# Patient Record
Sex: Male | Born: 1950 | Race: White | Hispanic: No | Marital: Married | Smoking: Former smoker
Health system: Southern US, Community
[De-identification: ages and names within clinical notes are randomized; demographics above are authoritative.]

## PROBLEM LIST (undated history)

## (undated) DIAGNOSIS — E041 Nontoxic single thyroid nodule: Secondary | ICD-10-CM

## (undated) DIAGNOSIS — Z9889 Other specified postprocedural states: Secondary | ICD-10-CM

## (undated) DIAGNOSIS — I10 Essential (primary) hypertension: Secondary | ICD-10-CM

## (undated) DIAGNOSIS — C73 Malignant neoplasm of thyroid gland: Secondary | ICD-10-CM

## (undated) DIAGNOSIS — Z973 Presence of spectacles and contact lenses: Secondary | ICD-10-CM

## (undated) DIAGNOSIS — R972 Elevated prostate specific antigen [PSA]: Secondary | ICD-10-CM

## (undated) DIAGNOSIS — G5603 Carpal tunnel syndrome, bilateral upper limbs: Secondary | ICD-10-CM

## (undated) DIAGNOSIS — Z86010 Personal history of colonic polyps: Secondary | ICD-10-CM

## (undated) DIAGNOSIS — C4491 Basal cell carcinoma of skin, unspecified: Secondary | ICD-10-CM

## (undated) DIAGNOSIS — M1990 Unspecified osteoarthritis, unspecified site: Secondary | ICD-10-CM

## (undated) DIAGNOSIS — K5790 Diverticulosis of intestine, part unspecified, without perforation or abscess without bleeding: Secondary | ICD-10-CM

## (undated) DIAGNOSIS — R7301 Impaired fasting glucose: Secondary | ICD-10-CM

## (undated) DIAGNOSIS — R112 Nausea with vomiting, unspecified: Secondary | ICD-10-CM

## (undated) DIAGNOSIS — E7800 Pure hypercholesterolemia, unspecified: Secondary | ICD-10-CM

## (undated) DIAGNOSIS — J61 Pneumoconiosis due to asbestos and other mineral fibers: Secondary | ICD-10-CM

## (undated) DIAGNOSIS — T4145XA Adverse effect of unspecified anesthetic, initial encounter: Secondary | ICD-10-CM

## (undated) DIAGNOSIS — R030 Elevated blood-pressure reading, without diagnosis of hypertension: Secondary | ICD-10-CM

## (undated) DIAGNOSIS — Z87442 Personal history of urinary calculi: Secondary | ICD-10-CM

## (undated) HISTORY — DX: Diverticulosis of intestine, part unspecified, without perforation or abscess without bleeding: K57.90

## (undated) HISTORY — DX: Elevated prostate specific antigen (PSA): R97.20

## (undated) HISTORY — DX: Basal cell carcinoma of skin, unspecified: C44.91

## (undated) HISTORY — DX: Elevated blood-pressure reading, without diagnosis of hypertension: R03.0

## (undated) HISTORY — DX: Carpal tunnel syndrome, bilateral upper limbs: G56.03

## (undated) HISTORY — DX: Pure hypercholesterolemia, unspecified: E78.00

## (undated) HISTORY — PX: OTHER SURGICAL HISTORY: SHX169

## (undated) HISTORY — DX: Pneumoconiosis due to asbestos and other mineral fibers: J61

## (undated) HISTORY — DX: Malignant neoplasm of thyroid gland: C73

## (undated) HISTORY — PX: LITHOTRIPSY: SUR834

## (undated) HISTORY — DX: Impaired fasting glucose: R73.01

## (undated) HISTORY — DX: Essential (primary) hypertension: I10

---
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
## 1998-02-06 ENCOUNTER — Ambulatory Visit (HOSPITAL_COMMUNITY): Admission: RE | Admit: 1998-02-06 | Discharge: 1998-02-06 | Payer: Self-pay

## 1998-02-06 ENCOUNTER — Encounter: Payer: Self-pay | Admitting: Family Medicine

## 2004-04-09 ENCOUNTER — Ambulatory Visit: Payer: Self-pay | Admitting: Family Medicine

## 2009-11-06 ENCOUNTER — Ambulatory Visit (HOSPITAL_COMMUNITY): Admission: RE | Admit: 2009-11-06 | Discharge: 2009-11-06 | Payer: Self-pay | Admitting: Family Medicine

## 2012-04-21 DIAGNOSIS — Z8639 Personal history of other endocrine, nutritional and metabolic disease: Secondary | ICD-10-CM

## 2012-04-21 HISTORY — DX: Personal history of other endocrine, nutritional and metabolic disease: Z86.39

## 2013-05-25 ENCOUNTER — Other Ambulatory Visit (HOSPITAL_COMMUNITY): Payer: Self-pay | Admitting: Family Medicine

## 2013-05-25 DIAGNOSIS — E049 Nontoxic goiter, unspecified: Secondary | ICD-10-CM

## 2013-05-30 ENCOUNTER — Ambulatory Visit (HOSPITAL_COMMUNITY): Payer: Self-pay

## 2013-05-31 ENCOUNTER — Ambulatory Visit (HOSPITAL_COMMUNITY)
Admission: RE | Admit: 2013-05-31 | Discharge: 2013-05-31 | Disposition: A | Discharge status: Home / Self Care | Payer: 59 | Source: Ambulatory Visit | Attending: Family Medicine | Admitting: Family Medicine

## 2013-05-31 DIAGNOSIS — E041 Nontoxic single thyroid nodule: Secondary | ICD-10-CM | POA: Insufficient documentation

## 2013-05-31 DIAGNOSIS — E049 Nontoxic goiter, unspecified: Secondary | ICD-10-CM

## 2013-05-31 DIAGNOSIS — J61 Pneumoconiosis due to asbestos and other mineral fibers: Secondary | ICD-10-CM | POA: Insufficient documentation

## 2013-06-01 ENCOUNTER — Other Ambulatory Visit: Payer: Self-pay | Admitting: Family Medicine

## 2013-06-01 DIAGNOSIS — E041 Nontoxic single thyroid nodule: Secondary | ICD-10-CM

## 2013-06-07 ENCOUNTER — Ambulatory Visit
Admission: RE | Admit: 2013-06-07 | Discharge: 2013-06-07 | Disposition: A | Discharge status: Home / Self Care | Payer: 59 | Source: Ambulatory Visit | Attending: Family Medicine | Admitting: Family Medicine

## 2013-06-07 ENCOUNTER — Other Ambulatory Visit (HOSPITAL_COMMUNITY)
Admission: RE | Admit: 2013-06-07 | Discharge: 2013-06-07 | Disposition: A | Discharge status: Home / Self Care | Payer: 59 | Source: Ambulatory Visit | Attending: Interventional Radiology | Admitting: Interventional Radiology

## 2013-06-07 DIAGNOSIS — E049 Nontoxic goiter, unspecified: Secondary | ICD-10-CM | POA: Insufficient documentation

## 2013-06-07 DIAGNOSIS — E041 Nontoxic single thyroid nodule: Secondary | ICD-10-CM

## 2013-06-14 ENCOUNTER — Telehealth (HOSPITAL_COMMUNITY): Payer: Self-pay | Admitting: Hematology and Oncology

## 2013-06-16 DIAGNOSIS — E041 Nontoxic single thyroid nodule: Secondary | ICD-10-CM | POA: Insufficient documentation

## 2013-06-17 ENCOUNTER — Encounter (HOSPITAL_COMMUNITY): Payer: 59

## 2013-06-17 ENCOUNTER — Encounter (HOSPITAL_COMMUNITY): Payer: Self-pay

## 2013-06-17 ENCOUNTER — Encounter (HOSPITAL_COMMUNITY): Payer: 59 | Attending: Hematology and Oncology

## 2013-06-17 VITALS — BP 153/97 | HR 77 | Temp 97.5°F | Resp 20 | Ht 66.0 in | Wt 174.0 lb

## 2013-06-17 DIAGNOSIS — E041 Nontoxic single thyroid nodule: Secondary | ICD-10-CM

## 2013-06-17 DIAGNOSIS — E7800 Pure hypercholesterolemia, unspecified: Secondary | ICD-10-CM | POA: Insufficient documentation

## 2013-06-17 DIAGNOSIS — I10 Essential (primary) hypertension: Secondary | ICD-10-CM | POA: Insufficient documentation

## 2013-06-17 DIAGNOSIS — J61 Pneumoconiosis due to asbestos and other mineral fibers: Secondary | ICD-10-CM | POA: Insufficient documentation

## 2013-06-17 DIAGNOSIS — Z85828 Personal history of other malignant neoplasm of skin: Secondary | ICD-10-CM | POA: Insufficient documentation

## 2013-06-17 DIAGNOSIS — Z85850 Personal history of malignant neoplasm of thyroid: Secondary | ICD-10-CM | POA: Insufficient documentation

## 2013-06-17 DIAGNOSIS — E039 Hypothyroidism, unspecified: Secondary | ICD-10-CM

## 2013-06-17 LAB — RETICULOCYTES
RBC.: 5.28 MIL/uL (ref 4.22–5.81)
Retic Count, Absolute: 52.8 10*3/uL (ref 19.0–186.0)
Retic Ct Pct: 1 % (ref 0.4–3.1)

## 2013-06-17 LAB — CBC WITH DIFFERENTIAL/PLATELET
BASOS PCT: 1 % (ref 0–1)
Basophils Absolute: 0 10*3/uL (ref 0.0–0.1)
EOS ABS: 0.4 10*3/uL (ref 0.0–0.7)
Eosinophils Relative: 4 % (ref 0–5)
HEMATOCRIT: 46.7 % (ref 39.0–52.0)
Hemoglobin: 16.4 g/dL (ref 13.0–17.0)
LYMPHS ABS: 2.5 10*3/uL (ref 0.7–4.0)
Lymphocytes Relative: 28 % (ref 12–46)
MCH: 31.1 pg (ref 26.0–34.0)
MCHC: 35.1 g/dL (ref 30.0–36.0)
MCV: 88.4 fL (ref 78.0–100.0)
MONO ABS: 0.6 10*3/uL (ref 0.1–1.0)
Monocytes Relative: 7 % (ref 3–12)
Neutro Abs: 5.4 10*3/uL (ref 1.7–7.7)
Neutrophils Relative %: 61 % (ref 43–77)
Platelets: 250 10*3/uL (ref 150–400)
RBC: 5.28 MIL/uL (ref 4.22–5.81)
RDW: 13.6 % (ref 11.5–15.5)
WBC: 8.8 10*3/uL (ref 4.0–10.5)

## 2013-06-17 LAB — COMPREHENSIVE METABOLIC PANEL
ALBUMIN: 4.4 g/dL (ref 3.5–5.2)
ALK PHOS: 38 U/L — AB (ref 39–117)
ALT: 21 U/L (ref 0–53)
AST: 24 U/L (ref 0–37)
BUN: 21 mg/dL (ref 6–23)
CO2: 23 meq/L (ref 19–32)
CREATININE: 0.96 mg/dL (ref 0.50–1.35)
Calcium: 9.5 mg/dL (ref 8.4–10.5)
Chloride: 104 mEq/L (ref 96–112)
GFR calc Af Amer: >90 mL/min (ref >90)
GFR, EST NON AFRICAN AMERICAN: 88 mL/min — AB (ref >90)
Glucose, Bld: 92 mg/dL (ref 70–99)
Potassium: 4.2 mEq/L (ref 3.7–5.3)
Sodium: 139 mEq/L (ref 137–147)
Total Bilirubin: 0.5 mg/dL (ref 0.3–1.2)
Total Protein: 7.6 g/dL (ref 6.0–8.3)

## 2013-06-17 LAB — LACTATE DEHYDROGENASE: LDH: 205 U/L (ref 94–250)

## 2013-06-17 NOTE — Progress Notes (Signed)
Jason Hansen. presented for labwork. Labs per MD order drawn via Peripheral Line 23 gauge needle inserted in Right AC per Lupita Raider, RN  Good blood return present. Procedure without incident.  Needle removed intact. Patient tolerated procedure well.

## 2013-06-17 NOTE — Progress Notes (Signed)
[place] A. Barnet Glasgow, M.D.  NEW PATIENT EVALUATION   Name: Jason Hansen. Date: 06/17/2013 MRN: [id number] DOB: 1950-06-27  PCP: Jason Mustache, MD   REFERRING PHYSICIAN: No ref. provider Hansen  REASON FOR REFERRAL: Thyroid nodule, status post FNA.     HISTORY OF PRESENT ILLNESS:Jason Hansen. is a 63 y.o. male who is referred by his primary care physician because of recent findings on FNA of a thyroid nodule. 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 The last day revealed an abnormality in the midportion of the left lobe of the thyroid gland. He underwent fine-needle aspiration of that area on 06/07/2013 with findings of follicular architecture with some papillary atypia. He is here today for discussion of the results and plans for management. He remains asymptomatic with Jason cough, wheezing, sore throat, headache, dysphagia, odynophagia, regurgitation, neck pain, but with chronic back pain. He denies any peripheral paresthesias other than the right upper extremity involving the thumb and first 2 fingers intermittently. He denies any lower extremity swelling or redness, PND, orthopnea, palpitations, headache, or seizures.   PAST MEDICAL HISTORY:  has a past medical history of Thyroid ca; Basal cell carcinoma (nose); Hypertension; Kidney stones (active bilaterally); and Asbestosis.     PAST SURGICAL HISTORY: Past Surgical History  Procedure Laterality Date  . Lithotripsy      pt states that this was done a long time ago     CURRENT MEDICATIONS: has a current medication list which includes the following prescription(s): aspirin, atorvastatin, and naproxen sodium.   ALLERGIES: Review of patient's allergies indicates Jason known allergies.   SOCIAL HISTORY:  reports that he has quit smoking. He has never used smokeless tobacco. He reports that he does not drink alcohol  or use illicit drugs.   FAMILY HISTORY: family history includes Breast cancer in his mother and sister; Colon cancer in his father.    REVIEW OF SYSTEMS:  Other than that discussed above is noncontributory.    PHYSICAL EXAM:  height is 5\' 6"  (1.676 m) and weight is 174 lb (78.926 kg). His oral temperature is 97.5 F (36.4 C). His blood pressure is 153/97 and his pulse is 77. His respiration is 20.    GENERAL:alert, Jason distress and comfortable SKIN: skin color, texture, turgor are normal, Jason rashes or significant lesions EYES: normal, Conjunctiva are pink and non-injected, sclera clear OROPHARYNX:Jason exudate, Jason erythema and lips, buccal mucosa, and tongue normal  NECK: supple, thyroid normal size, non-tender, without nodularity on physical exam. CHEST: Normal AP diameter with Jason breast masses. LYMPH:  Jason palpable lymphadenopathy in the cervical, axillary or inguinal LUNGS: clear to auscultation and percussion with normal breathing effort. Minimal is dry crackles at the bases. HEART: regular rate & rhythm and Jason murmurs ABDOMEN:abdomen soft, non-tender and normal bowel sounds MUSCULOSKELETALl:Jason cyanosis of digits, Jason clubbing or edema  NEURO: alert & oriented x 3 with fluent speech, Jason focal motor/sensory deficits    LABORATORY DATA:  Jason results Hansen for any previous visit.  Urinalysis Jason results Hansen for this basename: colorurine,  appearanceur,  labspec,  phurine,  glucoseu,  hgbur,  bilirubinur,  ketonesur,  proteinur,  urobilinogen,  nitrite,  leukocytesur      @RADIOGRAPHY : US Soft Tissue Head/neck  05/31/2013   CLINICAL DATA:  Thyromegaly, asbestosis.  EXAM: THYROID ULTRASOUND  TECHNIQUE: Ultrasound examination of the thyroid gland and adjacent soft tissues  was performed.  COMPARISON:  None.  FINDINGS: Right thyroid lobe  Measurements: 51 x 23 x 17 mm, inhomogeneous echotexture without focal lesion or hyperemia.  Left thyroid lobe  Measurements: 47 x 18 x 19 mm, with a  single 16 x 11 x 11 mm solid nodule, mid lobe.  Isthmus  Thickness: 3.2 mm.  Jason nodules visualized.  Lymphadenopathy  None visualized.  IMPRESSION: 1. Thyromegaly with a single left nodule. Findings meet consensus criteria for biopsy. Ultrasound-guided fine needle aspiration should be considered, as per the consensus statement: Management of Thyroid Nodules Detected at [place]: Society of Radiologists in [place]. Radiology 2005; [id number].   Electronically Signed   By: Jason Hansen M.D.   On: 05/31/2013 12:54   US Thyroid Biopsy  06/07/2013   CLINICAL DATA:  Dominant left thyroid nodule.  EXAM: ULTRASOUND GUIDED NEEDLE ASPIRATE BIOPSY OF THE THYROID GLAND  COMPARISON:  US SOFT TISSUE HEAD/NECK dated 05/31/2013  FINDINGS: Needle aspirate samples were obtained in different portions of the left thyroid nodule.  IMPRESSION: Ultrasound guided needle aspirate biopsy performed of the dominant left thyroid nodule.  PROCEDURE: Thyroid biopsy was thoroughly discussed with the patient and questions were answered. The benefits, risks, alternatives, and complications were also discussed. The patient understands and wishes to proceed with the procedure. Written consent was obtained.  Ultrasound was performed to localize and mark an adequate site for the biopsy. The patient was then prepped and draped in a normal sterile fashion. Local anesthesia was provided with 1% lidocaine. Using direct ultrasound guidance, 4 passes were made using 25 gauge needles into the nodule within the left lobe of the thyroid. Ultrasound was used to confirm needle placements on all occasions. Specimens were sent to Pathology for analysis.  Complications:  None   Electronically Signed   By: Jason Hansen M.D.   On: 06/07/2013 15:27    PATHOLOGY: for Jason Hansen ([id number]) Patient: Jason Hansen, Jason Hansen Collected: 06/07/2013 Client: Jason Hansen Accession: [id number] Received: 06/08/2013 Jason Edouard, MD DOB: [birth year] Age: 45 Gender: M Reported: 06/08/2013 [address] Patient Ph: [phone] MRN#: [id number] [place], [place] [address] Client Acc#: Chart: Phone: [phone] Fax: LMP: Visit#: [id number] CC: Jason Housekeeper, MD CYTOPATHOLOGY REPORT Adequacy Reason Satisfactory For Evaluation. Diagnosis THYROID, FINE NEEDLE ASPIRATION, LEFT (SPECIMEN 1 OF 1 COLLECTED [id number]) FOLLICULAR LESION OF UNDETERMINED SIGNIFICANCE. SEE COMMENT. COMMENT: THE SPECIMEN IS HYPERCELLULAR AND CONSISTS OF SMALL GROUPS OF FOLLICULAR EPITHELIAL CELLS WITH CYTOLOGIC ATYPIA. SOME EPITHELIAL CELLS CONTAIN INTRANUCLEAR GROOVES. BASED ON THESE FINDINGS, A FOLLICULAR LESION, INCLUDING FOLLICULAR VARIANT OF PAPILLARY THYROID CARCINOMA, CAN NOT BE ENTIRELY RULED OUT. Enid Cutter MD Pathologist, Electronic Signature (Case signed 06/08/2013) Specimen Clinical Information Thyromegaly, Asbestosis, 16 x 11 x 63mm solid nodule, left mid lobe Source Thyroid, Fine Needle Aspiration, Left, (Specimen 1 of 1, collected on 06/07/2013) Gross Specimen: Received is/are 30cc's of dark peach cytolyt solution and 6 slides in 95% ethyl alcohol.(PH:gw) Prepared: # Smears: 6 # Concentration Technique Slides (i.e. ThinPrep): 1 # Cell Block: 1 Cellient Additional Studies: N/A Report signed out from the following location(s) Technical Component and Interpretation performed at [facility], [place], [place] [address]. CLIA #: [id number],  IMPRESSION:  #1. 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 #2. Pulmonary asbestosis. #3. Hypercholesterolemia, on treatment.   PLAN:  #1. Thyroid function tests. #2. Thyroid uptake. #3. Followup in 2 weeks for discussion of findings. Is  a cold nodule is diagnosed, referral to surgery will be made. If not, suppression therapy with Synthroid will  be initiated. #4. Both the patient and his wife who accompanied him today and agree to this strategy.  I appreciate the opportunity of sharing in his care.   Doroteo Bradford, MD 06/17/2013 4:30 PM

## 2013-06-17 NOTE — Patient Instructions (Addendum)
[facility] Discharge Instructions  RECOMMENDATIONS MADE BY THE CONSULTANT AND ANY TEST RESULTS WILL BE SENT TO YOUR REFERRING PHYSICIAN.  EXAM FINDINGS BY THE PHYSICIAN TODAY AND SIGNS OR SYMPTOMS TO REPORT TO CLINIC OR PRIMARY PHYSICIAN:  We are doing labs today. We will have results by Monday 06/20/13.   Please return to see Dr. Barnet Glasgow in 2 weeks on Wednesday 06/29/13 @ 2:30.  Dr. Barnet Glasgow is ordering you a thyroid scan and this will determine the next step in this process.     Thank you for choosing [facility] to provide your oncology and hematology care.  To afford each patient quality time with our providers, please arrive at least 15 minutes before your scheduled appointment time.  With your help, our goal is to use those 15 minutes to complete the necessary work-up to ensure our physicians have the information they need to help with your evaluation and healthcare recommendations.    Effective January 1st, 2014, we ask that you re-schedule your appointment with our physicians should you arrive 10 or more minutes late for your appointment.  We strive to give you quality time with our providers, and arriving late affects you and other patients whose appointments are after yours.    Again, thank you for choosing [facility].  Our hope is that these requests will decrease the amount of time that you wait before being seen by our physicians.       _____________________________________________________________  Should you have questions after your visit to [facility], please contact our office at [phone] between the hours of 8:30 a.m. and 5:00 p.m.  Voicemails left after 4:30 p.m. will not be returned until the following business day.  For prescription refill requests, have your pharmacy contact our office with your prescription refill request.

## 2013-06-21 LAB — T3 UPTAKE: T3 UPTAKE RATIO: 30.6 % (ref 22.5–37.0)

## 2013-06-21 LAB — TSH: TSH: 1.052 u[IU]/mL (ref 0.350–4.500)

## 2013-06-21 LAB — T4, FREE: Free T4: 1.14 ng/dL (ref 0.80–1.80)

## 2013-06-21 LAB — T4: T4 TOTAL: 8.8 ug/dL (ref 5.0–12.5)

## 2013-06-23 ENCOUNTER — Encounter (HOSPITAL_COMMUNITY)
Admission: RE | Admit: 2013-06-23 | Discharge: 2013-06-23 | Disposition: A | Discharge status: Home / Self Care | Payer: 59 | Source: Ambulatory Visit | Attending: Hematology and Oncology | Admitting: Hematology and Oncology

## 2013-06-23 ENCOUNTER — Encounter (HOSPITAL_COMMUNITY): Payer: Self-pay

## 2013-06-23 DIAGNOSIS — J61 Pneumoconiosis due to asbestos and other mineral fibers: Secondary | ICD-10-CM

## 2013-06-23 DIAGNOSIS — E041 Nontoxic single thyroid nodule: Secondary | ICD-10-CM

## 2013-06-23 MED ORDER — SODIUM IODIDE I 131 CAPSULE
17.0000 | Freq: Once | INTRAVENOUS | Status: AC | PRN
  Administered 2013-06-23: 17 via ORAL

## 2013-06-24 ENCOUNTER — Encounter (HOSPITAL_COMMUNITY)
Admission: RE | Admit: 2013-06-24 | Discharge: 2013-06-24 | Disposition: A | Discharge status: Home / Self Care | Payer: 59 | Source: Ambulatory Visit | Attending: Hematology and Oncology | Admitting: Hematology and Oncology

## 2013-06-24 MED ORDER — SODIUM PERTECHNETATE TC 99M INJECTION
10.0000 | Freq: Once | INTRAVENOUS | Status: AC | PRN
  Administered 2013-06-24: 10 via INTRAVENOUS

## 2013-06-29 ENCOUNTER — Encounter (HOSPITAL_COMMUNITY): Payer: 59 | Attending: Hematology and Oncology

## 2013-06-29 ENCOUNTER — Other Ambulatory Visit (HOSPITAL_COMMUNITY): Payer: Self-pay | Admitting: Hematology and Oncology

## 2013-06-29 ENCOUNTER — Encounter (HOSPITAL_COMMUNITY): Payer: Self-pay

## 2013-06-29 VITALS — BP 135/82 | HR 69 | Temp 97.2°F | Resp 18 | Wt 175.0 lb

## 2013-06-29 DIAGNOSIS — C73 Malignant neoplasm of thyroid gland: Secondary | ICD-10-CM

## 2013-06-29 DIAGNOSIS — J61 Pneumoconiosis due to asbestos and other mineral fibers: Secondary | ICD-10-CM

## 2013-06-29 DIAGNOSIS — E041 Nontoxic single thyroid nodule: Secondary | ICD-10-CM | POA: Insufficient documentation

## 2013-06-29 DIAGNOSIS — E7800 Pure hypercholesterolemia, unspecified: Secondary | ICD-10-CM

## 2013-06-29 NOTE — Patient Instructions (Addendum)
[facility] Discharge Instructions  RECOMMENDATIONS MADE BY THE CONSULTANT AND ANY TEST RESULTS WILL BE SENT TO YOUR REFERRING PHYSICIAN.  EXAM FINDINGS BY THE PHYSICIAN TODAY AND SIGNS OR SYMPTOMS TO REPORT TO CLINIC OR PRIMARY PHYSICIAN: Exam and findings as discussed by Dr. Barnet Glasgow.  Will make a  referral for surgical consultation with Dr. Armandina Gemma.  MEDICATIONS PRESCRIBED:  none  INSTRUCTIONS/FOLLOW-UP: Follow-up with labs and office visit in 2 months.  Thank you for choosing [facility] to provide your oncology and hematology care.  To afford each patient quality time with our providers, please arrive at least 15 minutes before your scheduled appointment time.  With your help, our goal is to use those 15 minutes to complete the necessary work-up to ensure our physicians have the information they need to help with your evaluation and healthcare recommendations.    Effective January 1st, 2014, we ask that you re-schedule your appointment with our physicians should you arrive 10 or more minutes late for your appointment.  We strive to give you quality time with our providers, and arriving late affects you and other patients whose appointments are after yours.    Again, thank you for choosing [facility].  Our hope is that these requests will decrease the amount of time that you wait before being seen by our physicians.       _____________________________________________________________  Should you have questions after your visit to [facility], please contact our office at [phone] between the hours of 8:30 a.m. and 5:00 p.m.  Voicemails left after 4:30 p.m. will not be returned until the following business day.  For prescription refill requests, have your pharmacy contact our office with your prescription refill request.

## 2013-06-29 NOTE — Progress Notes (Signed)
Jason Hansen Hansen  OFFICE PROGRESS NOTE  Jason Hansen [place] [address]  DIAGNOSIS: No diagnosis found.  Chief Complaint  Patient presents with  . Thyroid Nodule     CURRENT THERAPY: Thyroid nodule, status post FNA.  INTERVAL HISTORY: Jason Hansen Hansen. 63 y.o. male returns for followup following thyroid scan after fine-needle aspirate of the mid left thyroid nodule was cellular but not diagnostic of malignancy. The nodule was discovered on CT scan which is done yearly by his pulmonary physician because of asbestosis.  He offers no new complaints. Appetite is good with no dysphagia, cough, stridor, PND, orthopnea, palpitations, lower extremity swelling or redness, peripheral paresthesias, joint pain, headache, or seizures.  MEDICAL HISTORY: Past Medical History  Diagnosis Date  . Thyroid ca   . Basal cell carcinoma nose    removed approx year 2000  . Hypertension   . Kidney stones active bilaterally  . Asbestosis     INTERIM HISTORY: has Thyroid nodule on his problem list.    ALLERGIES:  has No Known Allergies.  MEDICATIONS: has a current medication list which includes the following prescription(s): aspirin, atorvastatin, and naproxen sodium.  SURGICAL HISTORY:  Past Surgical History  Procedure Laterality Date  . Lithotripsy      pt states that this was done a long time ago    FAMILY HISTORY: family history includes Breast cancer in his mother and sister; Colon cancer in his father.  SOCIAL HISTORY:  reports that he has quit smoking. He has never used smokeless tobacco. He reports that he does not drink alcohol or use illicit drugs.  REVIEW OF SYSTEMS:  Other than that discussed above is noncontributory.  PHYSICAL EXAMINATION: ECOG PERFORMANCE STATUS: 0 - Asymptomatic  Weight 175 lb (79.379 kg).  GENERAL:alert, no distress and comfortable SKIN: skin color, texture, turgor are normal, no  rashes or significant lesions EYES: PERLA; Conjunctiva are pink and non-injected, sclera clear OROPHARYNX:no exudate, no erythema on lips, buccal mucosa, or tongue. NECK: supple, thyroid normal size, non-tender, without nodularity on physical exam. No masses CHEST: Normal AP diameter with no gynecomastia. LYMPH:  no palpable lymphadenopathy in the cervical, axillary or inguinal LUNGS: clear to auscultation and percussion with normal breathing effort HEART: regular rate & rhythm and no murmurs. ABDOMEN:abdomen soft, non-tender and normal bowel sounds MUSCULOSKELETAL:no cyanosis of digits and no clubbing. Range of motion normal.  NEURO: alert & oriented x 3 with fluent speech, no focal motor/sensory deficits   LABORATORY DATA: Office Visit on 06/17/2013  Component Date Value Ref Range Status  . WBC 06/17/2013 8.8  4.0 - 10.5 K/uL Final  . RBC 06/17/2013 5.28  4.22 - 5.81 MIL/uL Final  . Hemoglobin 06/17/2013 16.4  13.0 - 17.0 g/dL Final  . HCT 06/17/2013 46.7  39.0 - 52.0 % Final  . MCV 06/17/2013 88.4  78.0 - 100.0 fL Final  . MCH 06/17/2013 31.1  26.0 - 34.0 pg Final  . MCHC 06/17/2013 35.1  30.0 - 36.0 g/dL Final  . RDW 06/17/2013 13.6  11.5 - 15.5 % Final  . Platelets 06/17/2013 250  150 - 400 K/uL Final  . Neutrophils Relative % 06/17/2013 61  43 - 77 % Final  . Neutro Abs 06/17/2013 5.4  1.7 - 7.7 K/uL Final  . Lymphocytes Relative 06/17/2013 28  12 - 46 % Final  . Lymphs Abs 06/17/2013 2.5  0.7 - 4.0 K/uL Final  . Monocytes  Relative 06/17/2013 7  3 - 12 % Final  . Monocytes Absolute 06/17/2013 0.6  0.1 - 1.0 K/uL Final  . Eosinophils Relative 06/17/2013 4  0 - 5 % Final  . Eosinophils Absolute 06/17/2013 0.4  0.0 - 0.7 K/uL Final  . Basophils Relative 06/17/2013 1  0 - 1 % Final  . Basophils Absolute 06/17/2013 0.0  0.0 - 0.1 K/uL Final  . Retic Ct Pct 06/17/2013 1.0  0.4 - 3.1 % Final  . RBC. 06/17/2013 5.28  4.22 - 5.81 MIL/uL Final  . Retic Count, Manual 06/17/2013 52.8   19.0 - 186.0 K/uL Final  . Sodium 06/17/2013 139  137 - 147 mEq/L Final  . Potassium 06/17/2013 4.2  3.7 - 5.3 mEq/L Final  . Chloride 06/17/2013 104  96 - 112 mEq/L Final  . CO2 06/17/2013 23  19 - 32 mEq/L Final  . Glucose, Bld 06/17/2013 92  70 - 99 mg/dL Final  . BUN 06/17/2013 21  6 - 23 mg/dL Final  . Creatinine, Ser 06/17/2013 0.96  0.50 - 1.35 mg/dL Final  . Calcium 06/17/2013 9.5  8.4 - 10.5 mg/dL Final  . Total Protein 06/17/2013 7.6  6.0 - 8.3 g/dL Final  . Albumin 06/17/2013 4.4  3.5 - 5.2 g/dL Final  . AST 06/17/2013 24  0 - 37 U/L Final  . ALT 06/17/2013 21  0 - 53 U/L Final  . Alkaline Phosphatase 06/17/2013 38* 39 - 117 U/L Final  . Total Bilirubin 06/17/2013 0.5  0.3 - 1.2 mg/dL Final  . GFR calc non Af Amer 06/17/2013 88* >90 mL/min Final  . GFR calc Af Amer 06/17/2013 >90  >90 mL/min Final   Comment: (NOTE)                          The eGFR has been calculated using the CKD EPI equation.                          This calculation has not been validated in all clinical situations.                          eGFR's persistently <90 mL/min signify possible Chronic Kidney                          Disease.  Marland Kitchen LDH 06/17/2013 205  94 - 250 U/L Final  . Free T4 06/17/2013 1.14  0.80 - 1.80 ng/dL Final   Performed at [facility]  . T3 Uptake Ratio 06/17/2013 30.6  22.5 - 37.0 % Final   Performed at [facility]  . T4, Total 06/17/2013 8.8  5.0 - 12.5 ug/dL Final   Performed at [facility]  . TSH 06/17/2013 1.052  0.350 - 4.500 uIU/mL Final   Performed at [facility]:  -[phone]) Patient: Jason Hansen Hansen Collected: 06/07/2013 Client: Jason Hansen Hansen Accession: [id number] Received: 06/08/2013 Jason Hansen Edouard, MD DOB: Jul 10, 1950 Age: 6 Gender: M Reported: 06/08/2013 [address] Patient Ph: [phone] MRN#: [id number] Jason Hansen [place] [id number] Client Acc#: Chart: Phone: [phone] Fax: LMP: Visit#:  [id number].Jason Hansen Hansen CC: Jason Hansen Housekeeper, MD CYTOPATHOLOGY REPORT Adequacy Reason Satisfactory For Evaluation. Diagnosis THYROID, FINE NEEDLE ASPIRATION, LEFT (SPECIMEN 1 OF 1 COLLECTED [id number]) FOLLICULAR LESION OF UNDETERMINED SIGNIFICANCE. SEE COMMENT. COMMENT: THE SPECIMEN IS HYPERCELLULAR AND CONSISTS OF SMALL  GROUPS OF FOLLICULAR EPITHELIAL CELLS WITH CYTOLOGIC ATYPIA. SOME EPITHELIAL CELLS CONTAIN INTRANUCLEAR GROOVES. BASED ON THESE FINDINGS, A FOLLICULAR LESION, INCLUDING FOLLICULAR VARIANT OF PAPILLARY THYROID CARCINOMA, CAN NOT BE ENTIRELY RULED OUT. Enid Cutter MD Pathologist, Electronic Signature (Case signed 06/08/2013) Specimen Clinical Information Thyromegaly, Asbestosis, 16 x 11 x 50m solid nodule, left mid lobe Source Thyroid, Fine Needle Aspiration, Left, (Specimen 1 of 1, collected on 06/07/2013) Gross Specimen: Received is/are 30cc's of dark peach cytolyt solution and 6 slides in 95% ethyl alcohol.(PH:gw) Prepared: # Smears: 6 # Concentration Technique Slides (i.e. ThinPrep): 1 # Cell Block: 1 Cellient Additional Studies: N/A Report signed out from the following location(s) Technical Component and [place]ation [place] [id number] CLIA  Urinalysis No results found for this basename: colorurine, appearanceur, labspec, phurine, glucoseu, hgbur, bilirubinur, ketonesur, proteinur, urobilinogen, nitrite, leukocytesur    RADIOGRAPHIC STUDIES: Nm Thyroid Sng Uptake W/i, or your thyroid level and a maging  06/24/2013   CLINICAL DATA:  Thyroid nodule  EXAM: THYROID SCAN AND UPTAKE - 24 HOURS  TECHNIQUE: Following the per oral administration of I-131 sodium iodide, the patient returned at 24 hours and uptake measurements were acquired with the uptake probe centered on the neck. Thyroid imaging was performed following the intravenous administration of the Tc-961mertechnetate.  COMPARISON:  Thyroid sonogram  from 06/07/2013  RADIOPHARMACEUTICALS:  17.0 microCuries I-131 Sodium Iodide and 10.0 mCi TC-9936mrtechnetate  FINDINGS: The 24 hr radioactive iodine uptake is within normal limits equal to 14.6%. On the thyroids scan there is uniform tracer uptake throughout the right lobe. In the left lobe there is a area of relative decreased radiotracer uptake corresponding to the thyroid nodule recently biopsy.  IMPRESSION: 1. Normal 24 hr radioactive iodine uptake. 2. Relative decreased radiotracer uptake within left lobe of thyroid gland nodule.   Electronically Signed   By: TayKerby MoorsD.   On: 06/24/2013 15:56   Us [place] Tissue Head/neck  05/31/2013   CLINICAL DATA:  Thyromegaly, asbestosis.  EXAM: THYROID ULTRASOUND  TECHNIQUE: Ultrasound examination of the thyroid gland and adjacent soft tissues was performed.  COMPARISON:  None.  FINDINGS: Right thyroid lobe  Measurements: 51 x 23 x 17 mm, inhomogeneous echotexture without focal lesion or hyperemia.  Left thyroid lobe  Measurements: 47 x 18 x 19 mm, with a single 16 x 11 x 11 mm solid nodule, mid lobe.  Isthmus  Thickness: 3.2 mm.  No nodules visualized.  Lymphadenopathy  None visualized.  IMPRESSION: 1. Thyromegaly with a single left nodule. Findings meet consensus criteria for biopsy. Ultrasound-guided fine needle aspiration should be considered, as per the consensus statement: Management of Thyroid Nodules Detected at [place] Rad[place] 2005; [id number] Electronically Signed   By: DanArne ClevelandD.   On: 05/31/2013 12:54   Us Kore[place] Biopsy  06/07/2013   CLINICAL DATA:  Dominant left thyroid nodule.  EXAM: ULTRASOUND GUIDED NEEDLE ASPIRATE BIOPSY OF THE THYROID GLAND  COMPARISON:  [place] TISSUE HEAD/NECK dated 05/31/2013  FINDINGS: Needle aspirate samples were obtained in different portions of the left thyroid nodule.  IMPRESSION: Ultrasound guided needle aspirate biopsy performed of  the dominant left thyroid nodule.  PROCEDURE: Thyroid biopsy was thoroughly discussed with the patient and questions were answered. The benefits, risks, alternatives, and complications were also discussed. The patient understands and wishes to proceed with the procedure. Written consent was obtained.  Ultrasound was performed  to localize and mark an adequate site for the biopsy. The patient was then prepped and draped in a normal sterile fashion. Local anesthesia was provided with 1% lidocaine. Using direct ultrasound guidance, 4 passes were made using 25 gauge needles into the nodule within the left lobe of the thyroid. Ultrasound was used to confirm needle placements on all occasions. Specimens were sent to Pathology for analysis.  Complications:  None   Electronically Signed   By: Jason Hansen Hansen M.D.   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 Nodule measures 16 x 11 x 11 mm. #2. Asbestosis, asymptomatic. #3. Hypercholesterolemia, on treatment. #4. Normal thyroid function.    PLAN:  #1. Referral to Dr. Armandina Gemma for evaluation regarding near total thyroidectomy. #2. Office visit 2 months with CBC, chem profile, and TSH.   All questions were answered. The patient knows to call the clinic with any problems, questions or concerns. We can certainly see the patient much sooner if necessary.   I spent 25 minutes counseling the patient face to face. The total time spent in the appointment was 30 minutes.    Doroteo Bradford, MD 06/29/2013 2:31 PM

## 2013-07-14 ENCOUNTER — Encounter (INDEPENDENT_AMBULATORY_CARE_PROVIDER_SITE_OTHER): Payer: Self-pay | Admitting: Surgery

## 2013-07-14 ENCOUNTER — Ambulatory Visit (INDEPENDENT_AMBULATORY_CARE_PROVIDER_SITE_OTHER): Payer: 59 | Admitting: Surgery

## 2013-07-14 VITALS — BP 146/82 | HR 77 | Temp 98.0°F | Resp 16 | Ht 65.0 in | Wt 174.0 lb

## 2013-07-14 DIAGNOSIS — D440 Neoplasm of uncertain behavior of thyroid gland: Secondary | ICD-10-CM | POA: Insufficient documentation

## 2013-07-14 DIAGNOSIS — D449 Neoplasm of uncertain behavior of unspecified endocrine gland: Secondary | ICD-10-CM

## 2013-07-14 NOTE — Progress Notes (Signed)
General Surgery [facility]  Chief Complaint  Patient presents with  . New Evaluation    left thyroid nodule with cytologic atypia - referral from Dr. Farrel Gobble    HISTORY: Patient is a 63 year old male referred for evaluation of newly diagnosed left thyroid nodule. 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 An incidental finding was made of a left thyroid nodule. Subsequent ultrasound of the thyroid gland demonstrated a solitary 1.6 cm solid nodule in the left thyroid lobe. Thyroid uptake scan showed this to be a cold nodule. Ultrasound guided fine needle aspiration biopsy showed a follicular lesion with cytologic atypia including a hypercellular pattern with nuclear grooves. A follicular variant of papillary thyroid carcinoma could not be excluded. Patient is referred for surgical resection for definitive diagnosis and management.  Patient has no prior history of thyroid disease. He has never been on thyroid medication. He has had no prior surgery on the head or neck. There is no family history of thyroid cancer. There is no family history of endocrine neoplasms.  Past Medical History  Diagnosis Date  . Thyroid ca   . Basal cell carcinoma nose    removed approx year 2000  . Hypertension   . Kidney stones active bilaterally  . Asbestosis     Current Outpatient Prescriptions  Medication Sig Dispense Refill  . aspirin 81 MG tablet Take 81 mg by mouth daily.      Marland Kitchen atorvastatin (LIPITOR) 20 MG tablet Take 10 mg by mouth daily.      . naproxen sodium (ANAPROX) 220 MG tablet Take 220 mg by mouth as needed.       No current facility-administered medications for this visit.    No Known Allergies  Family History  Problem Relation Age of Onset  . Breast cancer Mother   . Cancer Mother   . Colon cancer Father   . Breast cancer Sister     History   Social History  . Marital Status: Married    Spouse Name:  N/A    Number of Children: N/A  . Years of Education: N/A   Social History Main Topics  . Smoking status: Former Smoker -- 1.00 packs/day for 10 years  . Smokeless tobacco: Never Used  . Alcohol Use: Yes  . Drug Use: No  . Sexual Activity: None   Other Topics Concern  . None   Social History Narrative  . None    REVIEW OF SYSTEMS - PERTINENT POSITIVES ONLY: Denies tremor. Denies palpitations. Denies compressive symptoms.  EXAM: Filed Vitals:   07/14/13 1325  BP: 146/82  Pulse: 77  Temp: 98 F (36.7 C)  Resp: 16    GENERAL: well-developed, well-nourished, no acute distress HEENT: normocephalic; pupils equal and reactive; sclerae clear; dentition good; mucous membranes moist NECK:  Right thyroid lobe without palpable abnormality; left thyroid lobe with a soft 1.5 cm nodule in the mid anterior portion of the lobe which is mobile with swallowing and nontender; symmetric on extension; no palpable anterior or posterior cervical lymphadenopathy; no supraclavicular masses; no tenderness CHEST: clear to auscultation bilaterally without rales, rhonchi, or wheezes CARDIAC: regular rate and rhythm without significant murmur; peripheral pulses are full EXT:  non-tender without edema; no deformity NEURO: no gross focal deficits; no sign of tremor   LABORATORY RESULTS: See [facility] (CHL-Epic) for most recent results  RADIOLOGY RESULTS: See [facility] (CHL-Epic) for most recent results  IMPRESSION: Left  thyroid nodule, 1.6 cm, with cytologic atypia  PLAN: I discussed the above findings at length with the patient and his wife. We discussed options for management. We discussed left thyroid lobectomy for definitive diagnosis versus proceeding with total thyroidectomy. We discussed the risk and benefits of each procedure. We discussed the possibility of recurrent laryngeal nerve injury and injury to parathyroid glands. We discussed the possible need for completion  thyroidectomy if the patient elected to undergo left thyroid lobectomy for diagnosis. We discussed the hospital stay to be anticipated and his postoperative recovery and return to work. We discussed the potential need for lifelong thyroid hormone replacement. I provided him with written literature to review at home.  Patient would like to proceed with left thyroid lobectomy for definitive diagnosis. We will make arrangements for surgery in the near future at a time convenient for the patient.  The risks and benefits of the procedure have been discussed at length with the patient.  The patient understands the proposed procedure, potential alternative treatments, and the course of recovery to be expected.  All of the patient's questions have been answered at this time.  The patient wishes to proceed with surgery.  Earnstine Regal, MD, [facility]  Primary Care Physician: Sherrie Mustache, MD

## 2013-07-14 NOTE — Patient Instructions (Signed)

## 2013-07-29 ENCOUNTER — Encounter (HOSPITAL_COMMUNITY): Payer: Self-pay | Admitting: Pharmacy Technician

## 2013-08-05 ENCOUNTER — Encounter (HOSPITAL_COMMUNITY)
Admission: RE | Admit: 2013-08-05 | Discharge: 2013-08-05 | Disposition: A | Discharge status: Home / Self Care | Payer: 59 | Source: Ambulatory Visit | Attending: Surgery | Admitting: Surgery

## 2013-08-05 ENCOUNTER — Encounter (INDEPENDENT_AMBULATORY_CARE_PROVIDER_SITE_OTHER): Payer: Self-pay

## 2013-08-05 ENCOUNTER — Encounter (HOSPITAL_COMMUNITY): Payer: Self-pay

## 2013-08-05 ENCOUNTER — Other Ambulatory Visit: Payer: Self-pay

## 2013-08-05 DIAGNOSIS — Z01812 Encounter for preprocedural laboratory examination: Secondary | ICD-10-CM | POA: Insufficient documentation

## 2013-08-05 DIAGNOSIS — Z01810 Encounter for preprocedural cardiovascular examination: Secondary | ICD-10-CM | POA: Insufficient documentation

## 2013-08-05 HISTORY — DX: Personal history of urinary calculi: Z87.442

## 2013-08-05 HISTORY — DX: Unspecified osteoarthritis, unspecified site: M19.90

## 2013-08-05 HISTORY — DX: Nontoxic single thyroid nodule: E04.1

## 2013-08-05 LAB — BASIC METABOLIC PANEL
BUN: 23 mg/dL (ref 6–23)
CALCIUM: 9.7 mg/dL (ref 8.4–10.5)
CHLORIDE: 103 meq/L (ref 96–112)
CO2: 26 mEq/L (ref 19–32)
CREATININE: 0.97 mg/dL (ref 0.50–1.35)
GFR calc non Af Amer: 87 mL/min — ABNORMAL LOW (ref >90)
Glucose, Bld: 125 mg/dL — ABNORMAL HIGH (ref 70–99)
Potassium: 4.9 mEq/L (ref 3.7–5.3)
Sodium: 141 mEq/L (ref 137–147)

## 2013-08-05 LAB — CBC
HCT: 48.1 % (ref 39.0–52.0)
Hemoglobin: 16.7 g/dL (ref 13.0–17.0)
MCH: 30.2 pg (ref 26.0–34.0)
MCHC: 34.7 g/dL (ref 30.0–36.0)
MCV: 87 fL (ref 78.0–100.0)
Platelets: 259 10*3/uL (ref 150–400)
RBC: 5.53 MIL/uL (ref 4.22–5.81)
RDW: 13.7 % (ref 11.5–15.5)
WBC: 6.6 10*3/uL (ref 4.0–10.5)

## 2013-08-05 NOTE — Pre-Procedure Instructions (Signed)
CT CHEST REPORT ON CHART FROM [place] - DONE 02-28-13. EKG WAS DONE TODAY - PREOP AT [facility]

## 2013-08-05 NOTE — Patient Instructions (Signed)
   YOUR SURGERY IS SCHEDULED AT [facility]  ON:  Friday 4/24  REPORT TO  SHORT STAY CENTER AT:  10:00 AM      PHONE # FOR SHORT STAY IS [phone]  DO NOT EAT OR DRINK ANYTHING AFTER MIDNIGHT THE NIGHT BEFORE YOUR SURGERY.  YOU MAY BRUSH YOUR TEETH, RINSE OUT YOUR MOUTH--BUT NO WATER, NO FOOD, NO CHEWING GUM, NO MINTS, NO CANDIES, NO CHEWING TOBACCO.  PLEASE TAKE THE FOLLOWING MEDICATIONS THE AM OF YOUR SURGERY WITH A FEW SIPS OF WATER:  LIPITOR   DO NOT BRING VALUABLES, MONEY, CREDIT CARDS.  DO NOT WEAR JEWELRY, MAKE-UP, NAIL POLISH AND NO METAL PINS OR CLIPS IN YOUR HAIR. CONTACT LENS, DENTURES / PARTIALS, GLASSES SHOULD NOT BE WORN TO SURGERY AND IN MOST CASES-HEARING AIDS WILL NEED TO BE REMOVED.  BRING YOUR GLASSES CASE, ANY EQUIPMENT NEEDED FOR YOUR CONTACT LENS. FOR PATIENTS ADMITTED TO THE HOSPITAL--CHECK OUT TIME THE DAY OF DISCHARGE IS 11:00 AM.  ALL INPATIENT ROOMS ARE PRIVATE - WITH BATHROOM, TELEPHONE, TELEVISION AND WIFI INTERNET.  IF YOU ARE BEING DISCHARGED THE SAME DAY OF YOUR SURGERY--YOU CAN NOT DRIVE YOURSELF HOME--AND SHOULD NOT GO HOME ALONE BY TAXI OR BUS.  NO DRIVING OR OPERATING MACHINERY, OR MAKING LEGAL DECISIONS FOR 24 HOURS FOLLOWING ANESTHESIA / PAIN MEDICATIONS.  PLEASE MAKE ARRANGEMENTS FOR SOMEONE TO BE WITH YOU AT HOME THE FIRST 24 HOURS AFTER SURGERY. RESPONSIBLE DRIVER'S NAME / PHONE                                                   PLEASE READ OVER ANY  FACT SHEETS THAT YOU WERE GIVEN: MRSA INFORMATION, BLOOD TRANSFUSION INFORMATION, INCENTIVE SPIROMETER INFORMATION.  FAILURE TO FOLLOW THESE INSTRUCTIONS MAY RESULT IN THE CANCELLATION OF YOUR SURGERY. PLEASE BE AWARE THAT YOU MAY NEED ADDITIONAL BLOOD DRAWN DAY OF YOUR SURGERY  PATIENT SIGNATURE_________________________________

## 2013-08-08 NOTE — Progress Notes (Signed)
Quick Note:  These results are acceptable for scheduled surgery.  Teshara Moree M. Mitzy Naron, MD, FACS [facility] Office: [phone]   ______ 

## 2013-08-10 MED ORDER — LIDOCAINE HCL 1 % IJ SOLN
INTRAMUSCULAR | Status: AC
  Filled 2013-08-10: qty 20

## 2013-08-12 ENCOUNTER — Encounter (HOSPITAL_COMMUNITY)
Admission: RE | Disposition: A | Discharge status: Home / Self Care | Payer: Self-pay | Source: Ambulatory Visit | Attending: Surgery

## 2013-08-12 ENCOUNTER — Ambulatory Visit (HOSPITAL_COMMUNITY): Payer: 59 | Admitting: Anesthesiology

## 2013-08-12 ENCOUNTER — Encounter (HOSPITAL_COMMUNITY): Payer: 59 | Admitting: Anesthesiology

## 2013-08-12 ENCOUNTER — Observation Stay (HOSPITAL_COMMUNITY)
Admission: RE | Admit: 2013-08-12 | Discharge: 2013-08-13 | Disposition: A | Discharge status: Home / Self Care | Payer: 59 | Source: Ambulatory Visit | Attending: Surgery | Admitting: Surgery

## 2013-08-12 ENCOUNTER — Encounter (HOSPITAL_COMMUNITY): Payer: Self-pay | Admitting: Surgery

## 2013-08-12 DIAGNOSIS — I10 Essential (primary) hypertension: Secondary | ICD-10-CM | POA: Insufficient documentation

## 2013-08-12 DIAGNOSIS — E041 Nontoxic single thyroid nodule: Secondary | ICD-10-CM | POA: Diagnosis present

## 2013-08-12 DIAGNOSIS — Z85828 Personal history of other malignant neoplasm of skin: Secondary | ICD-10-CM | POA: Insufficient documentation

## 2013-08-12 DIAGNOSIS — J61 Pneumoconiosis due to asbestos and other mineral fibers: Secondary | ICD-10-CM | POA: Insufficient documentation

## 2013-08-12 DIAGNOSIS — Z79899 Other long term (current) drug therapy: Secondary | ICD-10-CM | POA: Insufficient documentation

## 2013-08-12 DIAGNOSIS — D34 Benign neoplasm of thyroid gland: Principal | ICD-10-CM | POA: Insufficient documentation

## 2013-08-12 DIAGNOSIS — Z7982 Long term (current) use of aspirin: Secondary | ICD-10-CM | POA: Insufficient documentation

## 2013-08-12 DIAGNOSIS — Z87891 Personal history of nicotine dependence: Secondary | ICD-10-CM | POA: Insufficient documentation

## 2013-08-12 DIAGNOSIS — D440 Neoplasm of uncertain behavior of thyroid gland: Secondary | ICD-10-CM

## 2013-08-12 HISTORY — PX: THYROID LOBECTOMY: SHX420

## 2013-08-12 SURGERY — LOBECTOMY, THYROID
Anesthesia: General | Site: Neck | Laterality: Left

## 2013-08-12 MED ORDER — CEFAZOLIN SODIUM-DEXTROSE 2-3 GM-% IV SOLR
INTRAVENOUS | Status: AC
  Filled 2013-08-12: qty 50

## 2013-08-12 MED ORDER — EPHEDRINE SULFATE 50 MG/ML IJ SOLN
INTRAMUSCULAR | Status: AC
  Filled 2013-08-12: qty 1

## 2013-08-12 MED ORDER — SODIUM CHLORIDE 0.9 % IJ SOLN
INTRAMUSCULAR | Status: AC
  Filled 2013-08-12: qty 10

## 2013-08-12 MED ORDER — PROMETHAZINE HCL 25 MG/ML IJ SOLN: 6.2500 mg | INTRAMUSCULAR | Status: DC | PRN

## 2013-08-12 MED ORDER — FENTANYL CITRATE 0.05 MG/ML IJ SOLN
INTRAMUSCULAR | Status: DC | PRN
  Administered 2013-08-12: 25 ug via INTRAVENOUS
  Administered 2013-08-12: 100 ug via INTRAVENOUS

## 2013-08-12 MED ORDER — CEFAZOLIN SODIUM-DEXTROSE 2-3 GM-% IV SOLR
2.0000 g | INTRAVENOUS | Status: AC
  Administered 2013-08-12: 2 g via INTRAVENOUS

## 2013-08-12 MED ORDER — MIDAZOLAM HCL 2 MG/2ML IJ SOLN
INTRAMUSCULAR | Status: AC
  Filled 2013-08-12: qty 2

## 2013-08-12 MED ORDER — MIDAZOLAM HCL 5 MG/5ML IJ SOLN
INTRAMUSCULAR | Status: DC | PRN
Start: 2013-08-12 — End: 2013-08-12
  Administered 2013-08-12: 2 mg via INTRAVENOUS

## 2013-08-12 MED ORDER — OXYCODONE HCL 5 MG/5ML PO SOLN
5.0000 mg | Freq: Once | ORAL | Status: DC | PRN
  Filled 2013-08-12: qty 5

## 2013-08-12 MED ORDER — HYDROCODONE-ACETAMINOPHEN 5-325 MG PO TABS
1.0000 | ORAL_TABLET | ORAL | Status: DC | PRN
  Administered 2013-08-12 – 2013-08-13 (×4): 2 via ORAL
  Filled 2013-08-12 (×4): qty 2

## 2013-08-12 MED ORDER — PHENYLEPHRINE 40 MCG/ML (10ML) SYRINGE FOR IV PUSH (FOR BLOOD PRESSURE SUPPORT)
PREFILLED_SYRINGE | INTRAVENOUS | Status: AC
  Filled 2013-08-12: qty 10

## 2013-08-12 MED ORDER — SUCCINYLCHOLINE CHLORIDE 20 MG/ML IJ SOLN
INTRAMUSCULAR | Status: DC | PRN
  Administered 2013-08-12: 100 mg via INTRAVENOUS

## 2013-08-12 MED ORDER — DEXAMETHASONE SODIUM PHOSPHATE 10 MG/ML IJ SOLN
INTRAMUSCULAR | Status: DC | PRN
  Administered 2013-08-12: 10 mg via INTRAVENOUS

## 2013-08-12 MED ORDER — LIDOCAINE HCL (CARDIAC) 20 MG/ML IV SOLN
INTRAVENOUS | Status: AC
  Filled 2013-08-12: qty 5

## 2013-08-12 MED ORDER — PROPOFOL 10 MG/ML IV BOLUS
INTRAVENOUS | Status: DC | PRN
Start: 2013-08-12 — End: 2013-08-12
  Administered 2013-08-12: 130 mg via INTRAVENOUS

## 2013-08-12 MED ORDER — DEXAMETHASONE SODIUM PHOSPHATE 10 MG/ML IJ SOLN
INTRAMUSCULAR | Status: AC
  Filled 2013-08-12: qty 1

## 2013-08-12 MED ORDER — ROCURONIUM BROMIDE 100 MG/10ML IV SOLN
INTRAVENOUS | Status: AC
  Filled 2013-08-12: qty 1

## 2013-08-12 MED ORDER — ONDANSETRON HCL 4 MG/2ML IJ SOLN
INTRAMUSCULAR | Status: DC | PRN
  Administered 2013-08-12: 4 mg via INTRAVENOUS

## 2013-08-12 MED ORDER — GLYCOPYRROLATE 0.2 MG/ML IJ SOLN
INTRAMUSCULAR | Status: AC
  Filled 2013-08-12: qty 3

## 2013-08-12 MED ORDER — ACETAMINOPHEN 325 MG PO TABS: 650.0000 mg | ORAL_TABLET | ORAL | Status: DC | PRN

## 2013-08-12 MED ORDER — LACTATED RINGERS IV SOLN
INTRAVENOUS | Status: DC | PRN
Start: 2013-08-12 — End: 2013-08-12
  Administered 2013-08-12 (×2): via INTRAVENOUS

## 2013-08-12 MED ORDER — OXYCODONE HCL 5 MG PO TABS
5.0000 mg | ORAL_TABLET | Freq: Once | ORAL | Status: DC | PRN
Start: 2013-08-12 — End: 2013-08-12

## 2013-08-12 MED ORDER — CISATRACURIUM BESYLATE (PF) 10 MG/5ML IV SOLN
INTRAVENOUS | Status: DC | PRN
  Administered 2013-08-12: 6 mg via INTRAVENOUS

## 2013-08-12 MED ORDER — PHENYLEPHRINE HCL 10 MG/ML IJ SOLN
INTRAMUSCULAR | Status: DC | PRN
  Administered 2013-08-12 (×3): 80 ug via INTRAVENOUS

## 2013-08-12 MED ORDER — LIDOCAINE HCL (CARDIAC) 20 MG/ML IV SOLN
INTRAVENOUS | Status: DC | PRN
  Administered 2013-08-12: 70 mg via INTRAVENOUS

## 2013-08-12 MED ORDER — PROPOFOL 10 MG/ML IV BOLUS
INTRAVENOUS | Status: AC
  Filled 2013-08-12: qty 20

## 2013-08-12 MED ORDER — HYDROCODONE-ACETAMINOPHEN 5-325 MG PO TABS: 1.0000 | ORAL_TABLET | ORAL | Status: DC | PRN

## 2013-08-12 MED ORDER — FENTANYL CITRATE 0.05 MG/ML IJ SOLN
INTRAMUSCULAR | Status: AC
  Filled 2013-08-12: qty 5

## 2013-08-12 MED ORDER — GLYCOPYRROLATE 0.2 MG/ML IJ SOLN
INTRAMUSCULAR | Status: DC | PRN
  Administered 2013-08-12: 0.6 mg via INTRAVENOUS

## 2013-08-12 MED ORDER — CISATRACURIUM BESYLATE 20 MG/10ML IV SOLN
INTRAVENOUS | Status: AC
  Filled 2013-08-12: qty 10

## 2013-08-12 MED ORDER — HYDROMORPHONE HCL PF 1 MG/ML IJ SOLN
1.0000 mg | INTRAMUSCULAR | Status: DC | PRN
  Administered 2013-08-12: 1 mg via INTRAVENOUS
  Filled 2013-08-12: qty 1

## 2013-08-12 MED ORDER — KCL IN DEXTROSE-NACL 20-5-0.45 MEQ/L-%-% IV SOLN
INTRAVENOUS | Status: DC
  Administered 2013-08-12: 16:00:00 via INTRAVENOUS
  Filled 2013-08-12 (×2): qty 1000

## 2013-08-12 MED ORDER — ONDANSETRON HCL 4 MG/2ML IJ SOLN
INTRAMUSCULAR | Status: AC
  Filled 2013-08-12: qty 2

## 2013-08-12 MED ORDER — MEPERIDINE HCL 50 MG/ML IJ SOLN: 6.2500 mg | INTRAMUSCULAR | Status: DC | PRN

## 2013-08-12 MED ORDER — HYDROMORPHONE HCL PF 1 MG/ML IJ SOLN
INTRAMUSCULAR | Status: AC
  Filled 2013-08-12: qty 1

## 2013-08-12 MED ORDER — HYDROMORPHONE HCL PF 1 MG/ML IJ SOLN
0.2500 mg | INTRAMUSCULAR | Status: DC | PRN
  Administered 2013-08-12: 0.5 mg via INTRAVENOUS
  Administered 2013-08-12: 0.25 mg via INTRAVENOUS
  Administered 2013-08-12: 0.5 mg via INTRAVENOUS
  Administered 2013-08-12: 0.25 mg via INTRAVENOUS
  Administered 2013-08-12: 0.5 mg via INTRAVENOUS

## 2013-08-12 MED ORDER — NEOSTIGMINE METHYLSULFATE 1 MG/ML IJ SOLN
INTRAMUSCULAR | Status: DC | PRN
  Administered 2013-08-12: 4 mg via INTRAVENOUS

## 2013-08-12 SURGICAL SUPPLY — 38 items
ATTRACTOMAT 16X20 MAGNETIC DRP (DRAPES) ×3 IMPLANT
BENZOIN TINCTURE PRP APPL 2/3 (GAUZE/BANDAGES/DRESSINGS) ×3 IMPLANT
BLADE HEX COATED 2.75 (ELECTRODE) ×3 IMPLANT
BLADE SURG 15 STRL LF DISP TIS (BLADE) ×1 IMPLANT
BLADE SURG 15 STRL SS (BLADE) ×2
CANISTER SUCTION 2500CC (MISCELLANEOUS) ×3 IMPLANT
CHLORAPREP W/TINT 10.5 ML (MISCELLANEOUS) ×3 IMPLANT
CLIP TI MEDIUM 6 (CLIP) ×6 IMPLANT
CLIP TI WIDE RED SMALL 6 (CLIP) ×6 IMPLANT
CLOSURE WOUND 1/2 X4 (GAUZE/BANDAGES/DRESSINGS) ×1
DISSECTOR ROUND CHERRY 3/8 STR (MISCELLANEOUS) IMPLANT
DRAPE PED LAPAROTOMY (DRAPES) ×3 IMPLANT
DRESSING SURGICEL FIBRLLR 1X2 (HEMOSTASIS) ×1 IMPLANT
DRSG SURGICEL FIBRILLAR 1X2 (HEMOSTASIS) ×3
ELECT REM PT RETURN 9FT ADLT (ELECTROSURGICAL) ×3
ELECTRODE REM PT RTRN 9FT ADLT (ELECTROSURGICAL) ×1 IMPLANT
GAUZE SPONGE 4X4 16PLY XRAY LF (GAUZE/BANDAGES/DRESSINGS) ×3 IMPLANT
GLOVE SURG ORTHO 8.0 STRL STRW (GLOVE) ×3 IMPLANT
GOWN STRL REUS W/TWL LRG LVL3 (GOWN DISPOSABLE) ×6 IMPLANT
GOWN STRL REUS W/TWL XL LVL3 (GOWN DISPOSABLE) ×6 IMPLANT
KIT BASIN OR (CUSTOM PROCEDURE TRAY) ×3 IMPLANT
NS IRRIG 1000ML POUR BTL (IV SOLUTION) ×3 IMPLANT
PACK BASIC VI WITH GOWN DISP (CUSTOM PROCEDURE TRAY) ×3 IMPLANT
PENCIL BUTTON HOLSTER BLD 10FT (ELECTRODE) ×3 IMPLANT
SHEARS HARMONIC 9CM CVD (BLADE) ×3 IMPLANT
SPONGE GAUZE 4X4 12PLY (GAUZE/BANDAGES/DRESSINGS) ×3 IMPLANT
STAPLER VISISTAT 35W (STAPLE) ×3 IMPLANT
STRIP CLOSURE SKIN 1/2X4 (GAUZE/BANDAGES/DRESSINGS) ×2 IMPLANT
SUT MNCRL AB 4-0 PS2 18 (SUTURE) ×3 IMPLANT
SUT SILK 2 0 (SUTURE) ×2
SUT SILK 2-0 18XBRD TIE 12 (SUTURE) ×1 IMPLANT
SUT SILK 3 0 (SUTURE)
SUT SILK 3-0 18XBRD TIE 12 (SUTURE) IMPLANT
SUT VIC AB 3-0 SH 18 (SUTURE) ×3 IMPLANT
SYR BULB IRRIGATION 50ML (SYRINGE) ×3 IMPLANT
TAPE CLOTH SURG 4X10 WHT LF (GAUZE/BANDAGES/DRESSINGS) ×3 IMPLANT
TOWEL OR 17X26 10 PK STRL BLUE (TOWEL DISPOSABLE) ×3 IMPLANT
YANKAUER SUCT BULB TIP 10FT TU (MISCELLANEOUS) ×3 IMPLANT

## 2013-08-12 NOTE — H&P (View-Only) (Signed)
General Surgery [facility]  Chief Complaint  Patient presents with  . New Evaluation    left thyroid nodule with cytologic atypia - referral from Dr. Farrel Gobble    HISTORY: Patient is a 63 year old male referred for evaluation of newly diagnosed left thyroid nodule. 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 An incidental finding was made of a left thyroid nodule. Subsequent ultrasound of the thyroid gland demonstrated a solitary 1.6 cm solid nodule in the left thyroid lobe. Thyroid uptake scan showed this to be a cold nodule. Ultrasound guided fine needle aspiration biopsy showed a follicular lesion with cytologic atypia including a hypercellular pattern with nuclear grooves. A follicular variant of papillary thyroid carcinoma could not be excluded. Patient is referred for surgical resection for definitive diagnosis and management.  Patient has no prior history of thyroid disease. He has never been on thyroid medication. He has had no prior surgery on the head or neck. There is no family history of thyroid cancer. There is no family history of endocrine neoplasms.  Past Medical History  Diagnosis Date  . Thyroid ca   . Basal cell carcinoma nose    removed approx year 2000  . Hypertension   . Kidney stones active bilaterally  . Asbestosis     Current Outpatient Prescriptions  Medication Sig Dispense Refill  . aspirin 81 MG tablet Take 81 mg by mouth daily.      Marland Kitchen atorvastatin (LIPITOR) 20 MG tablet Take 10 mg by mouth daily.      . naproxen sodium (ANAPROX) 220 MG tablet Take 220 mg by mouth as needed.       No current facility-administered medications for this visit.    No Known Allergies  Family History  Problem Relation Age of Onset  . Breast cancer Mother   . Cancer Mother   . Colon cancer Father   . Breast cancer Sister     History   Social History  . Marital Status: Married    Spouse Name:  N/A    Number of Children: N/A  . Years of Education: N/A   Social History Main Topics  . Smoking status: Former Smoker -- 1.00 packs/day for 10 years  . Smokeless tobacco: Never Used  . Alcohol Use: Yes  . Drug Use: No  . Sexual Activity: None   Other Topics Concern  . None   Social History Narrative  . None    REVIEW OF SYSTEMS - PERTINENT POSITIVES ONLY: Denies tremor. Denies palpitations. Denies compressive symptoms.  EXAM: Filed Vitals:   07/14/13 1325  BP: 146/82  Pulse: 77  Temp: 98 F (36.7 C)  Resp: 16    GENERAL: well-developed, well-nourished, no acute distress HEENT: normocephalic; pupils equal and reactive; sclerae clear; dentition good; mucous membranes moist NECK:  Right thyroid lobe without palpable abnormality; left thyroid lobe with a soft 1.5 cm nodule in the mid anterior portion of the lobe which is mobile with swallowing and nontender; symmetric on extension; no palpable anterior or posterior cervical lymphadenopathy; no supraclavicular masses; no tenderness CHEST: clear to auscultation bilaterally without rales, rhonchi, or wheezes CARDIAC: regular rate and rhythm without significant murmur; peripheral pulses are full EXT:  non-tender without edema; no deformity NEURO: no gross focal deficits; no sign of tremor   LABORATORY RESULTS: See [facility] (CHL-Epic) for most recent results  RADIOLOGY RESULTS: See [facility] (CHL-Epic) for most recent results  IMPRESSION: Left  thyroid nodule, 1.6 cm, with cytologic atypia  PLAN: I discussed the above findings at length with the patient and his wife. We discussed options for management. We discussed left thyroid lobectomy for definitive diagnosis versus proceeding with total thyroidectomy. We discussed the risk and benefits of each procedure. We discussed the possibility of recurrent laryngeal nerve injury and injury to parathyroid glands. We discussed the possible need for completion  thyroidectomy if the patient elected to undergo left thyroid lobectomy for diagnosis. We discussed the hospital stay to be anticipated and his postoperative recovery and return to work. We discussed the potential need for lifelong thyroid hormone replacement. I provided him with written literature to review at home.  Patient would like to proceed with left thyroid lobectomy for definitive diagnosis. We will make arrangements for surgery in the near future at a time convenient for the patient.  The risks and benefits of the procedure have been discussed at length with the patient.  The patient understands the proposed procedure, potential alternative treatments, and the course of recovery to be expected.  All of the patient's questions have been answered at this time.  The patient wishes to proceed with surgery.  Earnstine Regal, MD, [facility]  Primary Care Physician: Sherrie Mustache, MD

## 2013-08-12 NOTE — Brief Op Note (Signed)
08/12/2013  12:23 PM  PATIENT:  Jason Hansen.  63 y.o. male  PRE-OPERATIVE DIAGNOSIS:  left thyroid nodule with aytipa  POST-OPERATIVE DIAGNOSIS:  left thyroid nodule with aytipa  PROCEDURE:  Procedure(s): THYROID LOBECTOMY (Left)  SURGEON:  Surgeon(s) and Role:    * Odis Hollingshead, MD - Assisting    * Earnstine Regal, MD - Primary  ANESTHESIA:   general  EBL:  Total I/O In: 1000 [I.V.:1000] Out: -   BLOOD ADMINISTERED:none  DRAINS: none   LOCAL MEDICATIONS USED:  NONE  SPECIMEN:  Excision  DISPOSITION OF SPECIMEN:  PATHOLOGY  COUNTS:  YES  TOURNIQUET:  * No tourniquets in log *  DICTATION: .Other Dictation: Dictation Number [id number]  PLAN OF CARE: Admit for overnight observation  PATIENT DISPOSITION:  PACU - hemodynamically stable.   Delay start of Pharmacological VTE agent (>24hrs) due to surgical blood loss or risk of bleeding: yes  Earnstine Regal, MD, [facility] Office: [phone]

## 2013-08-12 NOTE — Anesthesia Preprocedure Evaluation (Signed)
Anesthesia Evaluation  Patient identified by MRN, date of birth, ID band Patient awake    Reviewed: Allergy & Precautions, H&P , NPO status , Patient's Chart, lab work & pertinent test results  Airway Mallampati: II TM Distance: >3 FB Neck ROM: Full    Dental  (+) Dental Advisory Given   Pulmonary former smoker,  breath sounds clear to auscultation        Cardiovascular hypertension, Pt. on medications Rhythm:Regular Rate:Normal     Neuro/Psych negative neurological ROS  negative psych ROS   GI/Hepatic negative GI ROS, Neg liver ROS,   Endo/Other  negative endocrine ROS  Renal/GU negative Renal ROS     Musculoskeletal negative musculoskeletal ROS (+)   Abdominal   Peds  Hematology negative hematology ROS (+)   Anesthesia Other Findings   Reproductive/Obstetrics                           Anesthesia Physical Anesthesia Plan  ASA: II  Anesthesia Plan: General   Post-op Pain Management:    Induction: Intravenous  Airway Management Planned: Oral ETT  Additional Equipment:   Intra-op Plan:   Post-operative Plan: Extubation in OR  Informed Consent: I have reviewed the patients History and Physical, chart, labs and discussed the procedure including the risks, benefits and alternatives for the proposed anesthesia with the patient or authorized representative who has indicated his/her understanding and acceptance.   Dental advisory given  Plan Discussed with: CRNA  Anesthesia Plan Comments:         Anesthesia Quick Evaluation

## 2013-08-12 NOTE — Transfer of Care (Signed)
Immediate Anesthesia Transfer of Care Note  Patient: Jason Hansen.  Procedure(s) Performed: Procedure(s): THYROID LOBECTOMY (Left)  Patient Location: PACU  Anesthesia Type:General  Level of Consciousness: awake, alert  and oriented  Airway & Oxygen Therapy: Patient Spontanous Breathing and Patient connected to face mask oxygen  Post-op Assessment: Report given to PACU RN and Post -op Vital signs reviewed and stable  Post vital signs: Reviewed and stable  Complications: No apparent anesthesia complications

## 2013-08-12 NOTE — Anesthesia Postprocedure Evaluation (Signed)
Anesthesia Post Note  Patient: Jason Hansen.  Procedure(s) Performed: Procedure(s) (LRB): THYROID LOBECTOMY (Left)  Anesthesia type: General  Patient location: PACU  Post pain: Pain level controlled  Post assessment: Post-op Vital signs reviewed  Last Vitals: BP 139/87  Pulse 90  Temp(Src) 36.8 C (Oral)  Resp 18  SpO2 97%  Post vital signs: Reviewed  Level of consciousness: sedated  Complications: No apparent anesthesia complications

## 2013-08-12 NOTE — Interval H&P Note (Signed)
History and Physical Interval Note:  08/12/2013 9:01 AM  Jason Hansen.  has presented today for surgery, with the diagnosis of left thyroid nodule with atypia.  The various methods of treatment have been discussed with the patient and family. After consideration of risks, benefits and other options for treatment, the patient has consented to    Procedure(s): THYROID LOBECTOMY (Left) as a surgical intervention .    The patient's history has been reviewed, patient examined, no change in status, stable for surgery.  I have reviewed the patient's chart and labs.  Questions were answered to the patient's satisfaction.    Earnstine Regal, MD, [facility] Office: [place]

## 2013-08-13 NOTE — Progress Notes (Signed)
Assessment unchanged. Pt and wife verbalized understanding of dc instructions through teach back. Script x 1 given to pt as provided by MD. My Chart information discussed with pt. Discharged via wc to front entrance to meet awaiting vehicle to carry home. Accompanied by wife and NT.

## 2013-08-13 NOTE — Discharge Summary (Signed)
Physician Discharge Summary  Patient ID: Jason Hansen. MRN: [id number] DOB/AGE: 62-03-1951 62 y.o.  Admit date: 08/12/2013 Discharge date: 08/13/2013  Admission Diagnoses:  Discharge Diagnoses:  Principal Problem:   Neoplasm of uncertain behavior of thyroid gland, left lobe Active Problems:   Thyroid nodule   Discharged Condition: good  Hospital Course: Pt underwent partial thyroidectomy for a nodule.  Postoperatively, the patient mobilized in the hallways and advanced to a solid diet gradually.  Pain was well-controlled and transitioned off IV medications.    By the time of discharge, the patient was walking well the hallways, eating food well, breathing fine, having flatus.  Pain was-controlled on an oral regimen.  Based on meeting DC criteria and recovering well, I felt it was safe for the patient to be discharged home with close followup.  Instructions were discussed in detail.  They are written as well.    Consults: None  Significant Diagnostic Studies:   Treatments:   PHYSICIAN: Earnstine Regal, MD DATE OF BIRTH: 1950/11/04  DATE OF PROCEDURE: 08/12/2013  DATE OF DISCHARGE:  OPERATIVE REPORT  PREOPERATIVE DIAGNOSIS: Left thyroid nodule with cytologic atypia.  POSTOPERATIVE DIAGNOSIS: Left thyroid nodule with cytologic atypia.  PROCEDURE: Left thyroid lobectomy.  SURGEON: Earnstine Regal, MD  ASSISTANT: Odis Hollingshead, MD    Discharge Exam: Blood pressure 109/69, pulse 53, temperature 97.3 F (36.3 C), temperature source Oral, resp. rate 18, height 5\' 7"  (1.702 m), weight 173 lb (78.472 kg), SpO2 98.00%.  General: Pt awake/alert/oriented x4 in no major acute distress.  Wife in room Eyes: PERRL, normal EOM. Sclera nonicteric Neuro: CN II-XII intact w/o focal sensory/motor deficits. Lymph: No head/neck/groin lymphadenopathy Psych:  No delerium/psychosis/paranoia HENT: Normocephalic, Mucus membranes moist.  No thrush.  No hoarseness/stridor Neck: Supple,  No tracheal deviation.  Incision c/d/i w mild healing ridge.  No hematoma/swelling Chest: No pain.  Good respiratory excursion. CV:  Pulses intact.  Regular rhythm MS: Normal AROM mjr joints.  No obvious deformity Abdomen: Soft, Nondistended.  Nontender.  No incarcerated hernias. Ext:  SCDs BLE.  No significant edema.  No cyanosis Skin: No petechiae / purpura   Disposition: Final discharge disposition not confirmed  Discharge Orders   Future Appointments Provider Department Dept Phone   08/24/2013 11:15 AM Earnstine Regal, MD [facility] [place] [phone]   Future Orders Complete By Expires   Call MD for:  extreme fatigue  As directed    Call MD for:  hives  As directed    Call MD for:  persistant nausea and vomiting  As directed    Call MD for:  redness, tenderness, or signs of infection (pain, swelling, redness, odor or green/yellow discharge around incision site)  As directed    Call MD for:  severe uncontrolled pain  As directed    Call MD for:  As directed    Diet - low sodium heart healthy  As directed    Discharge instructions  As directed    Discharge wound care:  As directed    Driving Restrictions  As directed    Increase activity slowly  As directed    Lifting restrictions  As directed    May shower / Bathe  As directed    May walk up steps  As directed    Sexual Activity Restrictions  As directed    Walk with assistance  As directed        Medication List  aspirin EC 81 MG tablet  Take 81 mg by mouth every evening.     atorvastatin 20 MG tablet  Commonly known as:  LIPITOR  Take 10 mg by mouth daily.     HYDROcodone-acetaminophen 5-325 MG per tablet  Commonly known as:  NORCO/VICODIN  Take 1-2 tablets by mouth every 4 (four) hours as needed.     naproxen sodium 220 MG tablet  Commonly known as:  ANAPROX  Take 220 mg by mouth as needed.           Follow-up Information   Follow up with Earnstine Regal, MD. Schedule an appointment as soon  as possible for a visit in 2 weeks. (To follow up after your operation)    Specialty:  General Surgery   Contact information:   [address] [place] [address] [phone]       Signed: Adin Hector 08/13/2013, 11:59 AM

## 2013-08-13 NOTE — Discharge Instructions (Signed)
THYROID & PARATHYROID SURGERY - POST OP INSTRUCTIONS  Always review your discharge instruction sheet from the facility where your surgery was performed.  A prescription for pain medication may be given to you upon discharge.  Take your pain medication as prescribed.  If narcotic pain medicine is not needed, then you may take acetaminophen (Tylenol) or ibuprofen (Advil) as needed.  Take your usually prescribed medications unless otherwise directed.  If you need a refill on your pain medication, please contact your pharmacy. They will contact our office to request authorization.  Prescriptions will not be processed after 5 pm or on weekends.  Start with a light diet upon arrival home, such as soup and crackers or toast.  Be sure to drink plenty of fluids daily.  Resume your normal diet the day after surgery.  Most patients will experience some swelling and bruising on the chest and neck area.  Ice packs will help.  Swelling and bruising can take several days to resolve.   It is common to experience some constipation if taking pain medication after surgery.  Increasing fluid intake and taking a stool softener will usually help or prevent this problem.  A mild laxative (Milk of Magnesia or Miralax) should be taken according to package directions if there are no bowel movements after 48 hours.  You may remove your bandages 24-48 hours after surgery, and you may shower at that time.  You have steri-strips (small skin tapes) in place directly over the incision.  These strips should be left on the skin for 7-10 days and then removed.  You may resume regular (light) daily activities beginning the next day--such as daily self-care, walking, climbing stairs--gradually increasing activities as tolerated.  You may have sexual intercourse when it is comfortable.  Refrain from any heavy lifting or straining until approved by your doctor.  You may drive when you no longer are taking prescription pain medication,  you can comfortably wear a seatbelt, and you can safely maneuver your car and apply brakes.  You should see your doctor in the office for a follow-up appointment approximately two to three weeks after your surgery.  Make sure that you call for this appointment within a day or two after you arrive home to insure a convenient appointment time.  WHEN TO CALL YOUR DOCTOR: -- Fever greater than 101.5 -- Inability to urinate -- Nausea and/or vomiting - persistent -- Extreme swelling or bruising -- Continued bleeding from incision -- Increased pain, redness, or drainage from the incision -- Difficulty swallowing or breathing -- Muscle cramping or spasms -- Numbness or tingling in hands or around lips  The clinic staff is available to answer your questions during regular business hours.  Please dont hesitate to call and ask to speak to one of the nurses if you have concerns.  Earnstine Regal, MD, [facility] Office: [phone]  Managing Pain  Pain after surgery or related to activity is often due to strain/injury to muscle, tendon, nerves and/or incisions.  This pain is usually short-term and will improve in a few months.   Many people find it helpful to do the following things TOGETHER to help speed the process of healing and to get back to regular activity more quickly:  1. Avoid heavy physical activity a.  no lifting greater than 20 pounds b. Do not push through the pain.  Listen to your body and avoid positions and maneuvers than reproduce the pain c. Walking is  okay as tolerated, but go slowly and stop when getting sore.  d. Remember: If it hurts to do it, then dont do it! 2. Take Anti-inflammatory medication  a. Take with food/snack around the clock for 1-2 weeks i. This helps the muscle and nerve tissues become less irritable and calm down faster b. Choose ONE of the following over-the-counter medications: i. Naproxen 220mg   tabs (ex. Aleve) 1-2 pills twice a day  ii. Ibuprofen 200mg  tabs (ex. Advil, Motrin) 3-4 pills with every meal and just before bedtime iii. Acetaminophen 500mg  tabs (Tylenol) 1-2 pills with every meal and just before bedtime 3. Use a Heating pad or Ice/Cold Pack a. 4-6 times a day b. May use warm bath/hottub  or showers 4. Try Gentle Massage and/or Stretching  a. at the area of pain many times a day b. stop if you feel pain - do not overdo it  Try these steps together to help you body heal faster and avoid making things get worse.  Doing just one of these things may not be enough.    If you are not getting better after two weeks or are noticing you are getting worse, contact our office for further advice; we may need to re-evaluate you & see what other things we can do to help.

## 2013-08-13 NOTE — Op Note (Signed)
NAMEMarland Kitchen  Jason Hansen, Jason Hansen NO.:  [id number]  MEDICAL RECORD NO.:  [id number]  LOCATION:  108                         FACILITY:  [facility]  PHYSICIAN:  Earnstine Regal, MD      DATE OF BIRTH:  1950/04/28  DATE OF PROCEDURE:  08/12/2013                              OPERATIVE REPORT   PREOPERATIVE DIAGNOSIS:  Left thyroid nodule with cytologic atypia.  POSTOPERATIVE DIAGNOSIS:  Left thyroid nodule with cytologic atypia.  PROCEDURE:  Left thyroid lobectomy.  SURGEON:  Earnstine Regal, MD, FACS  ASSISTANT:  Odis Hollingshead, MD, FACS  ANESTHESIA:  General.  ESTIMATED BLOOD LOSS:  Minimal.  PREPARATION:  ChloraPrep.  COMPLICATIONS:  None.  INDICATIONS:  The patient is a 63 year old male, referred for newly diagnosed left thyroid nodule.  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  Ultrasound demonstrated a solitary 1.6 cm solid nodule.  Fine- needle aspiration biopsy showed a follicular lesion with cytologic atypia including a hypercellular pattern with nuclear grooves.  The patient now comes to Surgery for resection for definitive diagnosis.  BODY OF REPORT:  Procedures done in OR #6 at the [facility].  The patient was brought to the operating room, placed in supine position on the operating room table.  Following administration of general anesthesia, the patient was positioned and then prepped and draped in the usual aseptic fashion.  After ascertaining that an adequate level of anesthesia had been achieved, a Kocher incision was made with a #15 blade.  Dissection was carried through subcutaneous tissues.  Platysma was divided with the electrocautery.  Skin flaps were elevated cephalad and caudad from the thyroid notch to the sternal notch.  A Mahorner self-retaining retractor was placed for exposure. Strap muscles were incised in the midline and dissection was begun on the  left side.  Strap muscles were reflected laterally exposing a normal size left thyroid lobe.  There was a palpable nodule in the central portion of the lobe.  Lobe was gently mobilized.  Venous tributaries were divided between small and medium Ligaclips with the Harmonic scalpel.  Superior pole vessels were dissected out and divided individually between small and medium Ligaclips with the Harmonic scalpel.  Superior and inferior parathyroid glands were identified and preserved.  Branches of the inferior thyroid artery were divided between small Ligaclips with the Harmonic scalpel.  Ligament of Gwenlyn Found was released with the electrocautery and the gland was mobilized onto the anterior trachea.  Isthmus was mobilized across the midline.  There was no significant pyramidal lobe present.  Thyroid tissue was divided with the Harmonic scalpel at the junction of the isthmus and right thyroid lobe. Left thyroid lobe was submitted to Pathology for review.  Palpation of the right lobe shows no gross abnormality.  Neck was irrigated with warm saline.  Good hemostasis was achieved.  Fibrillar was placed throughout the operative field.  Strap muscles were reapproximated in the midline with interrupted 3-0 Vicryl sutures.  Platysma was closed with interrupted 3-0 Vicryl sutures.  Skin was closed with a running 4-0 Monocryl subcuticular suture.  Wound was washed and dried and benzoin Steri-Strips were  applied.  Sterile dressings were applied.  The patient was awakened from anesthesia and brought to the recovery room.  The patient tolerated the procedure well.   Earnstine Regal, MD, [facility] Office: [phone]   TMG/MEDQ  D:  08/12/2013  T:  08/12/2013  Job:  [id number]  cc:   Dr. Farrel Gobble

## 2013-08-13 NOTE — Plan of Care (Signed)
Problem: Phase I Progression Outcomes Goal: Serum calcium levels obtained Outcome: Not Met (add Reason) No serum calcium level noted for today.

## 2013-08-15 ENCOUNTER — Encounter (HOSPITAL_COMMUNITY): Payer: Self-pay | Admitting: Surgery

## 2013-08-16 ENCOUNTER — Telehealth (INDEPENDENT_AMBULATORY_CARE_PROVIDER_SITE_OTHER): Payer: Self-pay

## 2013-08-16 NOTE — Telephone Encounter (Signed)
Per Dr Gala Lewandowsky request pt advised of benign pathology result.

## 2013-08-16 NOTE — Progress Notes (Signed)
Quick Note:  Please contact patient and notify of benign pathology results.  Quron Ruddy M. Taige Housman, MD, FACS [facility] Office: [phone]   ______ 

## 2013-08-24 ENCOUNTER — Encounter (INDEPENDENT_AMBULATORY_CARE_PROVIDER_SITE_OTHER): Payer: Self-pay

## 2013-08-24 ENCOUNTER — Ambulatory Visit (INDEPENDENT_AMBULATORY_CARE_PROVIDER_SITE_OTHER): Payer: 59 | Admitting: Surgery

## 2013-08-24 ENCOUNTER — Encounter (INDEPENDENT_AMBULATORY_CARE_PROVIDER_SITE_OTHER): Payer: Self-pay | Admitting: Surgery

## 2013-08-24 ENCOUNTER — Other Ambulatory Visit (HOSPITAL_COMMUNITY): Payer: 59

## 2013-08-24 VITALS — BP 118/70 | HR 78 | Temp 97.0°F | Resp 16 | Ht 66.5 in | Wt 171.6 lb

## 2013-08-24 DIAGNOSIS — D449 Neoplasm of uncertain behavior of unspecified endocrine gland: Secondary | ICD-10-CM

## 2013-08-24 DIAGNOSIS — D440 Neoplasm of uncertain behavior of thyroid gland: Secondary | ICD-10-CM

## 2013-08-24 DIAGNOSIS — E041 Nontoxic single thyroid nodule: Secondary | ICD-10-CM

## 2013-08-24 NOTE — Progress Notes (Signed)
General Surgery [facility]  Chief Complaint  Patient presents with  . Routine Post Op    left thyroid lobectomy 08/12/2013    HISTORY: The patient is a 63 year old male who underwent left thyroid lobectomy in April 2015. 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 Margins are negative. Pathology is feels that this is a low-grade lesion.  EXAM: Surgical incision is healing nicely. Steri-Strips are removed. Mild soft tissue swelling. Voice quality is normal. No sign of seroma. No sign of infection.  IMPRESSION: Hurthle cell neoplasm, 1 cm, margins negative  PLAN: Patient will have a TSH level in 3 weeks. He will return for wound check in 6 weeks. We will continue to follow him with intermittent physical examination and ultrasound based on his final pathology.  Patient is scheduled to return to work next week.  Earnstine Regal, MD, [facility]   Visit Diagnoses: 1. Thyroid nodule   2. Neoplasm of uncertain behavior of thyroid gland, left lobe

## 2013-08-24 NOTE — Patient Instructions (Signed)
  CARE OF INCISION   Apply cocoa butter/vitamin E cream (Palmer's brand) to your incision 2 - 3 times daily.  Massage cream into incision for one minute with each application.  Use sunscreen (50 SPF or higher) for first 6 months after surgery if area is exposed to sun.  You may alternate Mederma or other scar reducing cream with cocoa butter cream if desired.       Catalyna Reilly M. Emrah Ariola, MD, FACS      [facility]      Office: [phone]    

## 2013-08-25 ENCOUNTER — Ambulatory Visit (HOSPITAL_COMMUNITY): Payer: 59

## 2013-08-26 NOTE — Progress Notes (Signed)
This encounter was created in error - please disregard.

## 2013-08-30 ENCOUNTER — Ambulatory Visit (HOSPITAL_COMMUNITY): Payer: 59

## 2013-09-15 LAB — TSH: TSH: 1.798 u[IU]/mL (ref 0.350–4.500)

## 2013-10-07 ENCOUNTER — Encounter (INDEPENDENT_AMBULATORY_CARE_PROVIDER_SITE_OTHER): Payer: Self-pay | Admitting: Surgery

## 2013-10-07 ENCOUNTER — Ambulatory Visit (INDEPENDENT_AMBULATORY_CARE_PROVIDER_SITE_OTHER): Payer: 59 | Admitting: Surgery

## 2013-10-07 VITALS — BP 122/85 | HR 68 | Temp 96.9°F | Resp 14 | Ht 65.0 in | Wt 170.6 lb

## 2013-10-07 DIAGNOSIS — E041 Nontoxic single thyroid nodule: Secondary | ICD-10-CM

## 2013-10-07 DIAGNOSIS — D449 Neoplasm of uncertain behavior of unspecified endocrine gland: Secondary | ICD-10-CM

## 2013-10-07 DIAGNOSIS — D440 Neoplasm of uncertain behavior of thyroid gland: Secondary | ICD-10-CM

## 2013-10-07 NOTE — Progress Notes (Signed)
General Surgery [facility]  Chief Complaint  Patient presents with  . 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 Postoperative TSH level is normal at 1.798 on no thyroid hormone supplementation. He returns today for wound check.  EXAM: Surgical incision is healed nicely. Minimal soft tissue swelling. No sign of seroma. No sign of infection. Voice quality is normal.  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 He does not require thyroid hormone supplementation.  Patient will undergo thyroid ultrasound of his remaining right lobe in one year. He will return following that study for physical examination.  Earnstine Regal, MD, [facility]   Visit Diagnoses: 1. Neoplasm of uncertain behavior of thyroid gland, left lobe   2. Thyroid nodule

## 2013-10-07 NOTE — Patient Instructions (Signed)
Thyroid-Stimulating Hormone The amount of thyroid-stimulating hormone (TSH) or thyrotropin can be measured from a sample of blood. The TSH level can help diagnose thyroid gland or pituitary gland disorders, or monitor treatment of hypothyroidism and hyperthyroidism. TSH is produced by the pituitary gland, a tiny organ located below the brain. The pituitary gland is part of the body's feedback system to maintain stable levels of thyroid hormones released into the blood. Thyroid hormones help control the rate at which the body uses energy. The pituitary gland monitors the level of thyroid hormones released by the thyroid gland. The thyroid gland is a small butterfly-shaped gland that lies flat against the windpipe. If the thyroid gland does not release enough thyroid hormones, the pituitary gland detects the reduced thyroid hormone levels. The pituitary gland then makes more TSH to trigger the thyroid gland to produce more thyroid hormones. This increase in TSH is an effort to return the low thyroid hormones to normal levels. The increased TSH level is caused by the low thyroid hormone levels of an underactive thyroid (hypothyroidism). Symptoms of hypothyroidism include menstrual irregularities in women, weight gain, dry skin, constipation, cold intolerance, and fatigue. Rarely, a high TSH level can indicate a problem with the pituitary gland. A high TSH level could also occur when there is an insufficient level of thyroid hormone medication in individuals receiving that medication. If the thyroid gland releases too much thyroid hormones, the pituitary gland detects the increased thyroid hormone levels. The pituitary gland then makes less TSH to slow the thyroid gland from producing thyroid hormones. This decrease in TSH is an effort to return the increased thyroid hormones to normal levels. The decreased TSH level is caused by the excess thyroid hormone levels of an overactive thyroid gland (hyperthyroidism).  Symptoms associated with hyperthyroidism include rapid heart rate, weight loss, nervousness, hand tremors, irritated eyes, and difficulty sleeping. Rarely, a low TSH level can indicate a problem with the pituitary gland. PREPARATION FOR TEST No specific preparation is required for this blood test. A blood sample is obtained from a needle placed in a vein in your arm or from pricking the heel of an infant.  NORMAL FINDINGS  Adult: 0.5-5 milli-international Units/L (0.5-5 mIU/L)  Newborn: 3-20 milli-international Units/L (3-20 mIU/L)  Cord: 3-12 milli-international Units/L (3-12 mIU/L) Ranges for normal findings may vary among different laboratories and hospitals. You should always check with your doctor after having lab work or other tests done to discuss the meaning of your test results and whether your values are considered within normal limits. MEANING OF TEST  Your caregiver will go over the test results with you and discuss the importance and meaning of your results, as well as treatment options and the need for additional tests if necessary. OBTAINING THE TEST RESULTS It is your responsibility to obtain your test results. Ask the lab or department performing the test when and how you will get your results. Document Released: 05/02/2004 Document Revised: 06/30/2011 Document Reviewed: 03/19/2008 [facility] Patient Information 2015 [place], [place]. This information is not intended to replace advice given to you by your health care provider. Make sure you discuss any questions you have with your health care provider.

## 2014-07-26 ENCOUNTER — Other Ambulatory Visit: Payer: Self-pay | Admitting: Surgery

## 2014-07-26 DIAGNOSIS — E890 Postprocedural hypothyroidism: Secondary | ICD-10-CM

## 2014-07-26 DIAGNOSIS — Z9009 Acquired absence of other part of head and neck: Secondary | ICD-10-CM

## 2014-07-27 ENCOUNTER — Other Ambulatory Visit: Payer: Self-pay | Admitting: Surgery

## 2014-07-27 DIAGNOSIS — E890 Postprocedural hypothyroidism: Secondary | ICD-10-CM

## 2014-07-27 DIAGNOSIS — Z9009 Acquired absence of other part of head and neck: Secondary | ICD-10-CM

## 2014-08-18 ENCOUNTER — Other Ambulatory Visit: Payer: Self-pay | Admitting: Gastroenterology

## 2014-09-04 ENCOUNTER — Ambulatory Visit
Admission: RE | Admit: 2014-09-04 | Discharge: 2014-09-04 | Disposition: A | Discharge status: Home / Self Care | Payer: 59 | Source: Ambulatory Visit | Attending: Surgery | Admitting: Surgery

## 2015-06-10 IMAGING — US US SOFT TISSUE HEAD/NECK
1 series · 14 of 25 positions shown · non-contrast
Comparison: None.

CLINICAL DATA: Thyromegaly, asbestosis.

EXAM:
THYROID ULTRASOUND
TECHNIQUE: Ultrasound examination of the thyroid gland and adjacent soft
tissues was performed.

[Series 1: us soft tissue head/neck · 0.07mm/px · 14 of 42 slices shown]
[im 1/42]
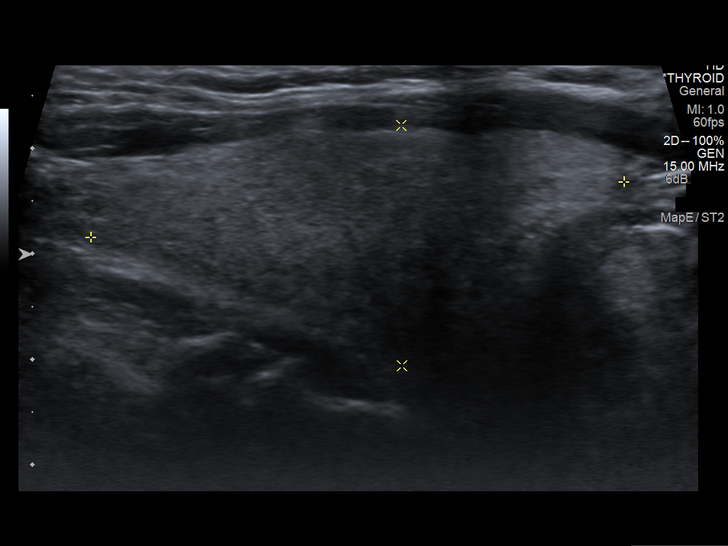
[im 4/42]
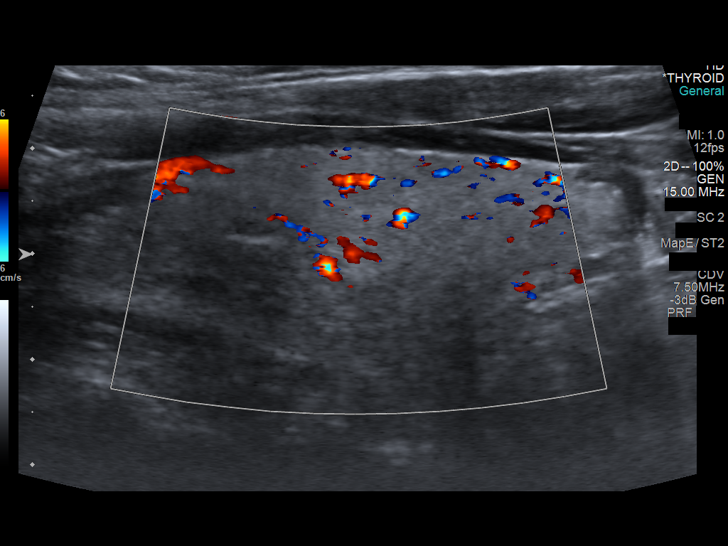
[im 7/42]
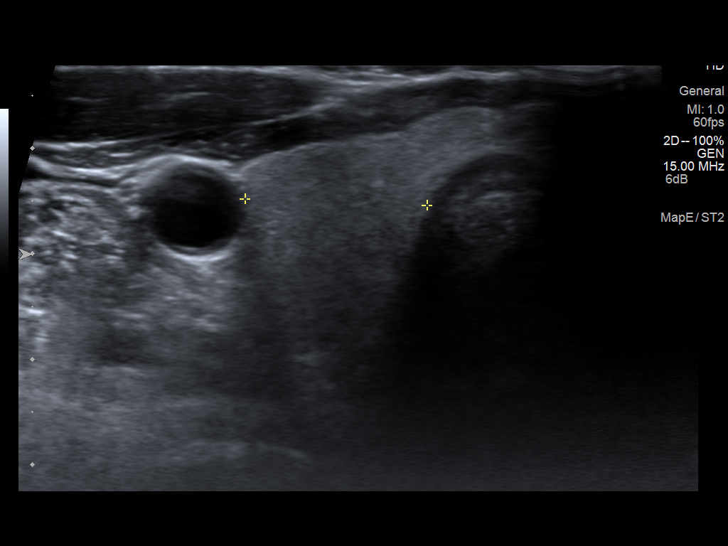
[im 11/42]
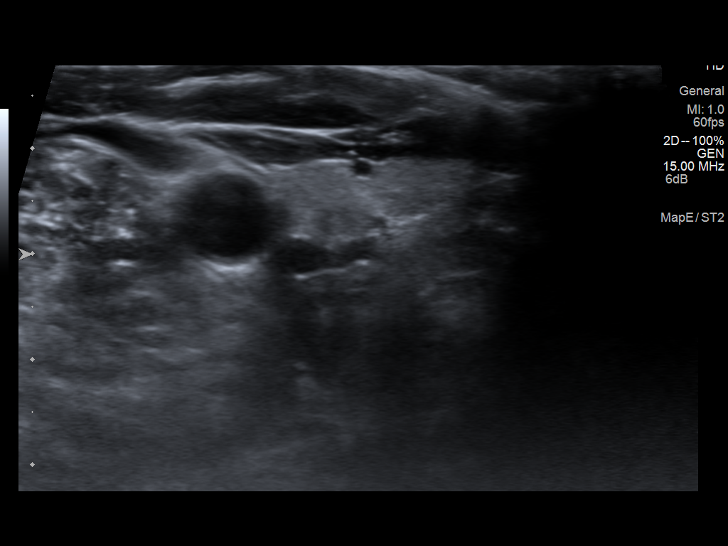
[im 14/42]
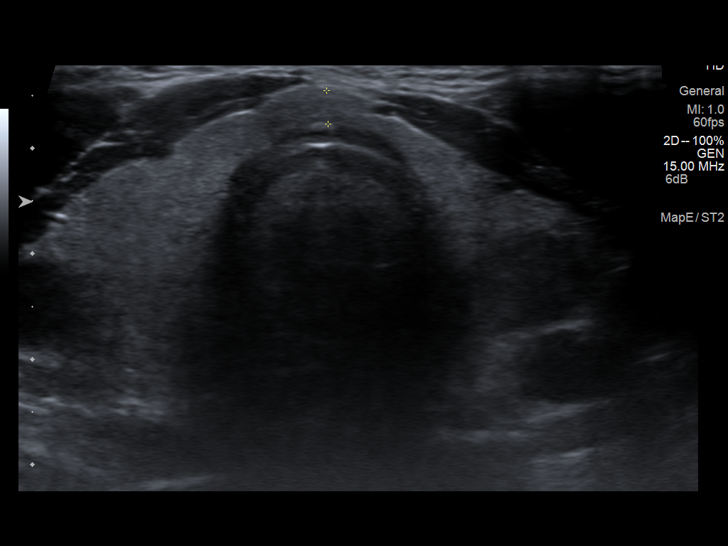
[im 16/42]
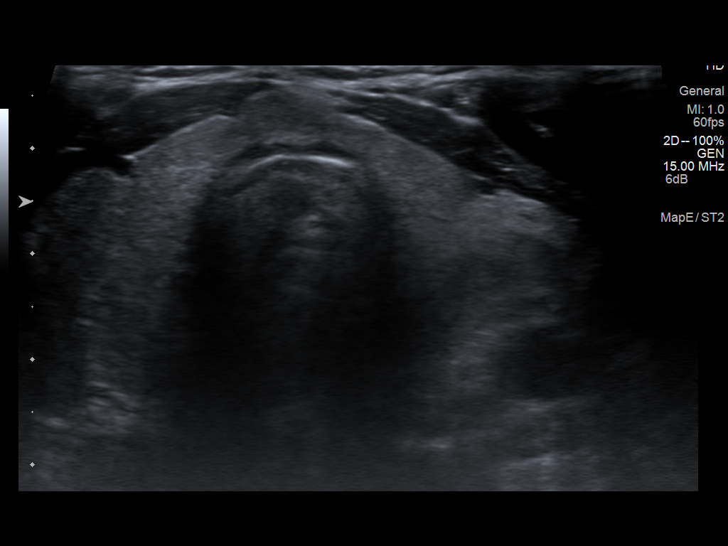
[im 19/42]
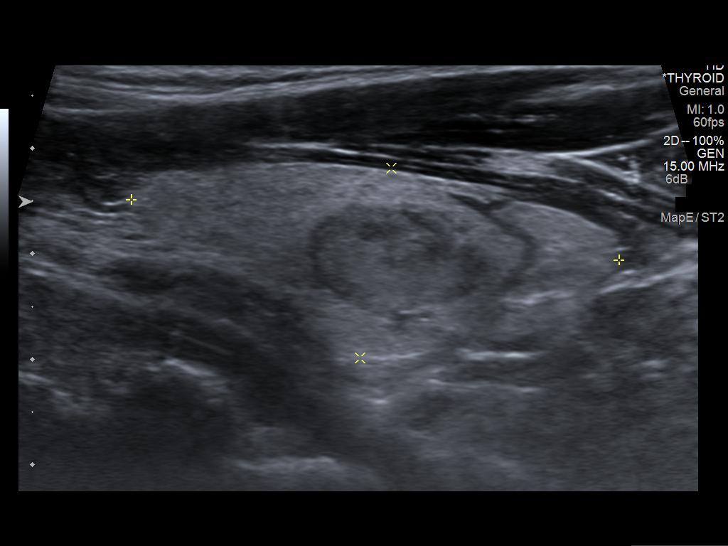
[im 23/42]
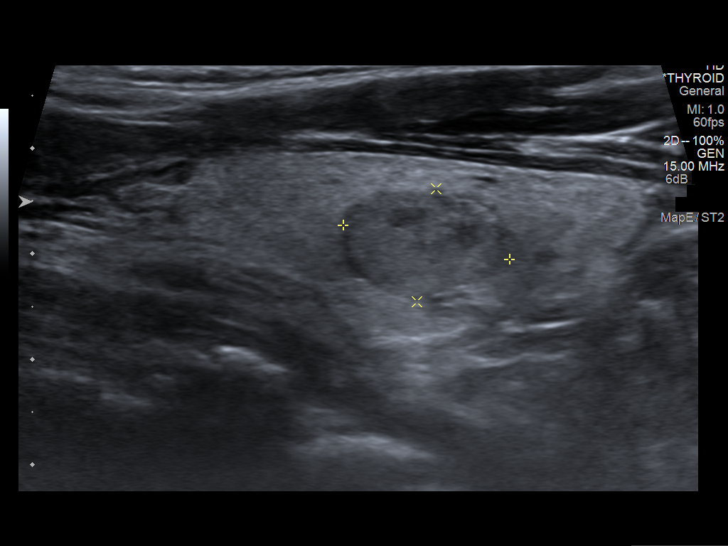
[im 26/42]
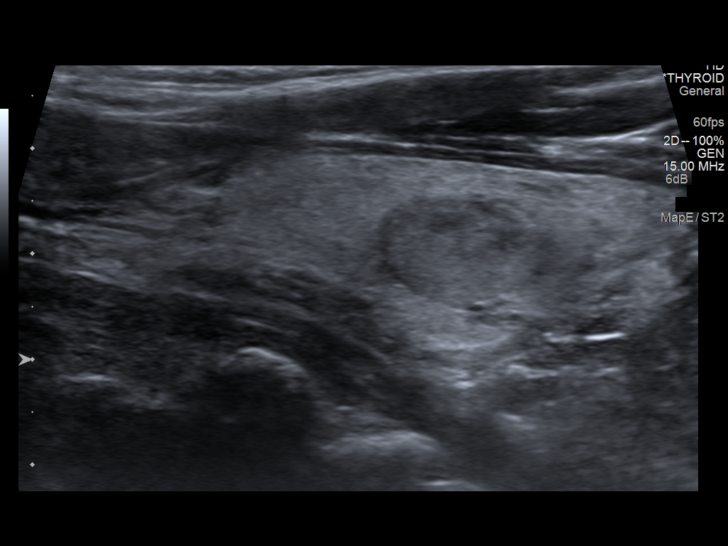
[im 28/42]
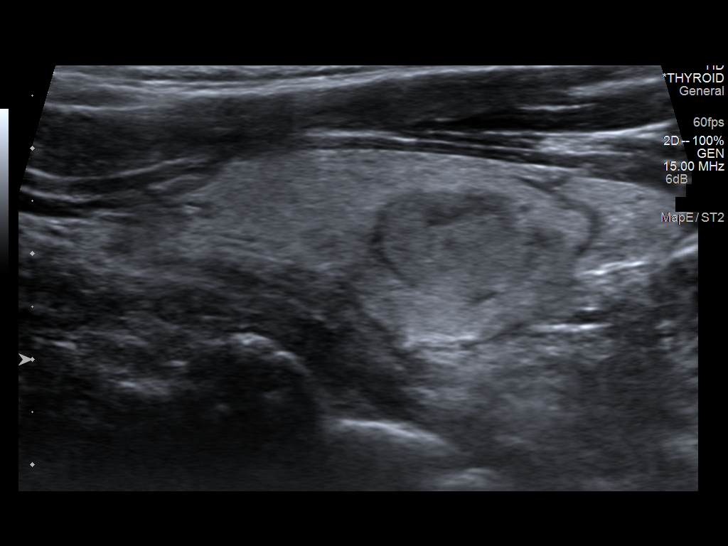
[im 31/42]
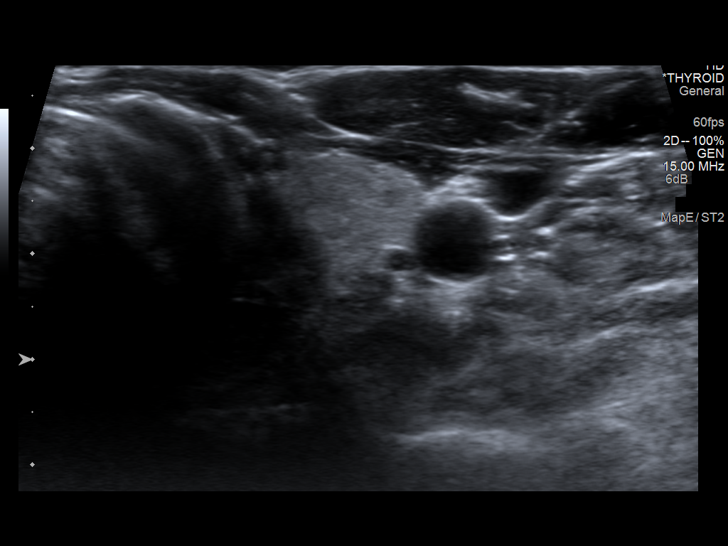
[im 35/42]
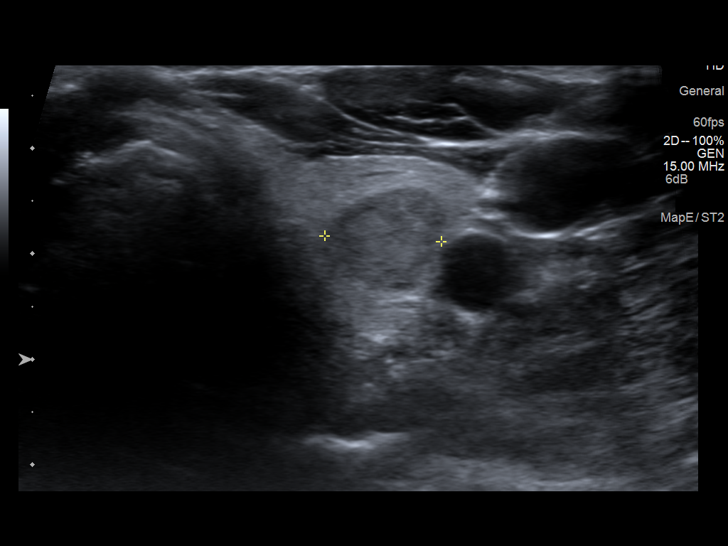
[im 38/42]
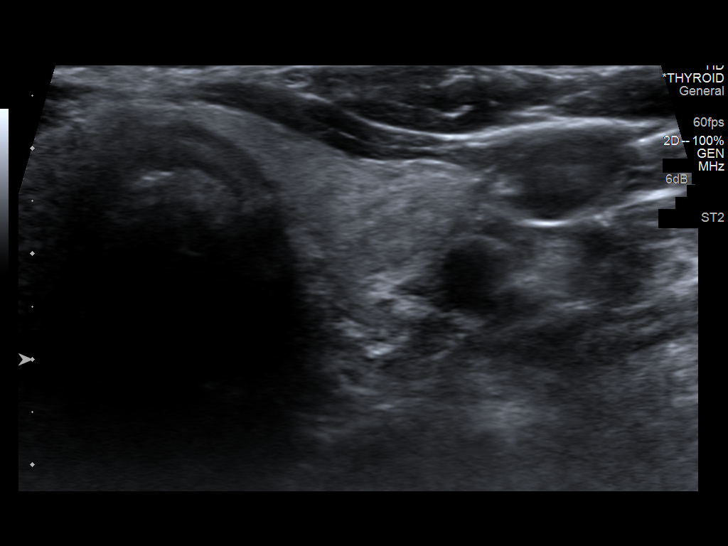
[im 42/42]
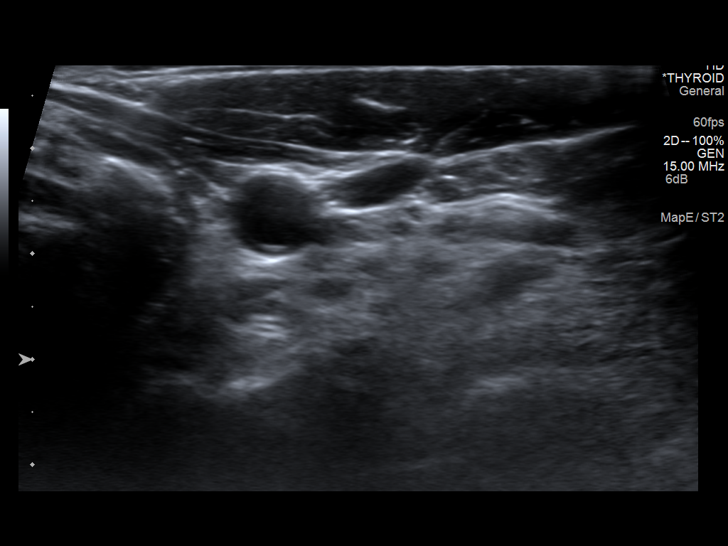

[14 of 25 positions shown; findings below may reference images not displayed]

FINDINGS: Right thyroid lobe

Measurements: 51 x 23 x 17 mm, inhomogeneous echotexture without
focal lesion or hyperemia..

Left thyroid lobe

Measurements: 47 x 18 x 19 mm, with a single 16 x 11 x 11 mm solid
nodule, mid lobe..

Isthmus

Thickness: 3.2 mm.  No nodules visualized.

Lymphadenopathy

None visualized.
IMPRESSION: 1. Thyromegaly with a single left nodule. Findings meet consensus
criteria for biopsy. Ultrasound-guided fine needle aspiration should
be considered, as per the consensus statement: Management of Thyroid
Nodules Detected at US: Society of Radiologists in Ultrasound

## 2018-01-31 ENCOUNTER — Emergency Department (HOSPITAL_COMMUNITY): Payer: Medicare Other | Admitting: Certified Registered Nurse Anesthetist

## 2018-01-31 ENCOUNTER — Other Ambulatory Visit: Payer: Self-pay

## 2018-01-31 ENCOUNTER — Emergency Department (HOSPITAL_COMMUNITY): Payer: Medicare Other

## 2018-01-31 ENCOUNTER — Encounter (HOSPITAL_COMMUNITY): Payer: Self-pay | Admitting: Emergency Medicine

## 2018-01-31 ENCOUNTER — Encounter (HOSPITAL_COMMUNITY)
Admission: EM | Disposition: A | Discharge status: Home / Self Care | Payer: Self-pay | Source: Home / Self Care | Attending: Emergency Medicine

## 2018-01-31 ENCOUNTER — Emergency Department (HOSPITAL_COMMUNITY)
Admission: EM | Admit: 2018-01-31 | Discharge: 2018-01-31 | Disposition: A | Discharge status: Home / Self Care | Payer: Medicare Other | Attending: Emergency Medicine | Admitting: Emergency Medicine

## 2018-01-31 DIAGNOSIS — Z87891 Personal history of nicotine dependence: Secondary | ICD-10-CM | POA: Insufficient documentation

## 2018-01-31 DIAGNOSIS — Z85828 Personal history of other malignant neoplasm of skin: Secondary | ICD-10-CM | POA: Diagnosis not present

## 2018-01-31 DIAGNOSIS — N132 Hydronephrosis with renal and ureteral calculous obstruction: Secondary | ICD-10-CM | POA: Diagnosis not present

## 2018-01-31 DIAGNOSIS — N201 Calculus of ureter: Secondary | ICD-10-CM

## 2018-01-31 DIAGNOSIS — R102 Pelvic and perineal pain: Secondary | ICD-10-CM | POA: Diagnosis present

## 2018-01-31 DIAGNOSIS — I10 Essential (primary) hypertension: Secondary | ICD-10-CM | POA: Insufficient documentation

## 2018-01-31 DIAGNOSIS — Z7982 Long term (current) use of aspirin: Secondary | ICD-10-CM | POA: Insufficient documentation

## 2018-01-31 DIAGNOSIS — Z85850 Personal history of malignant neoplasm of thyroid: Secondary | ICD-10-CM | POA: Diagnosis not present

## 2018-01-31 HISTORY — PX: CYSTOSCOPY WITH STENT PLACEMENT: SHX5790

## 2018-01-31 LAB — CBC WITH DIFFERENTIAL/PLATELET
Abs Immature Granulocytes: 0.08 10*3/uL — ABNORMAL HIGH (ref 0.00–0.07)
BASOS ABS: 0.1 10*3/uL (ref 0.0–0.1)
Basophils Relative: 0 %
EOS PCT: 0 %
Eosinophils Absolute: 0 10*3/uL (ref 0.0–0.5)
HCT: 49 % (ref 39.0–52.0)
HEMOGLOBIN: 16.1 g/dL (ref 13.0–17.0)
Immature Granulocytes: 1 %
Lymphocytes Relative: 6 %
Lymphs Abs: 1.1 10*3/uL (ref 0.7–4.0)
MCH: 29.8 pg (ref 26.0–34.0)
MCHC: 32.9 g/dL (ref 30.0–36.0)
MCV: 90.6 fL (ref 80.0–100.0)
MONO ABS: 0.7 10*3/uL (ref 0.1–1.0)
Monocytes Relative: 4 %
NRBC: 0 % (ref 0.0–0.2)
Neutro Abs: 15.6 10*3/uL — ABNORMAL HIGH (ref 1.7–7.7)
Neutrophils Relative %: 89 %
Platelets: 301 10*3/uL (ref 150–400)
RBC: 5.41 MIL/uL (ref 4.22–5.81)
RDW: 13.6 % (ref 11.5–15.5)
WBC: 17.7 10*3/uL — AB (ref 4.0–10.5)

## 2018-01-31 LAB — URINALYSIS, ROUTINE W REFLEX MICROSCOPIC
Bacteria, UA: NONE SEEN
Bilirubin Urine: NEGATIVE
Glucose, UA: NEGATIVE mg/dL
KETONES UR: 15 mg/dL — AB
Leukocytes, UA: NEGATIVE
NITRITE: NEGATIVE
Specific Gravity, Urine: >1.03 — ABNORMAL HIGH (ref 1.005–1.030)
pH: 5 (ref 5.0–8.0)

## 2018-01-31 LAB — I-STAT CHEM 8, ED
BUN: 28 mg/dL — AB (ref 8–23)
CALCIUM ION: 1.16 mmol/L (ref 1.15–1.40)
CREATININE: 1.2 mg/dL (ref 0.61–1.24)
Chloride: 112 mmol/L — ABNORMAL HIGH (ref 98–111)
Glucose, Bld: 210 mg/dL — ABNORMAL HIGH (ref 70–99)
HCT: 49 % (ref 39.0–52.0)
HEMOGLOBIN: 16.7 g/dL (ref 13.0–17.0)
Potassium: 4 mmol/L (ref 3.5–5.1)
Sodium: 145 mmol/L (ref 135–145)
TCO2: 19 mmol/L — AB (ref 22–32)

## 2018-01-31 LAB — BASIC METABOLIC PANEL
BUN: 28 — AB (ref 4–21)
Creatinine: 1.2 (ref 0.6–1.3)
Glucose: 210
Potassium: 4 (ref 3.4–5.3)
Sodium: 145 (ref 137–147)

## 2018-01-31 LAB — CBC AND DIFFERENTIAL
HCT: 49 (ref 41–53)
Hemoglobin: 16.1 (ref 13.5–17.5)
Neutrophils Absolute: 16
Platelets: 301 (ref 150–399)
WBC: 17.7

## 2018-01-31 SURGERY — CYSTOSCOPY, WITH STENT INSERTION
Anesthesia: General | Site: Ureter | Laterality: Left

## 2018-01-31 MED ORDER — LACTATED RINGERS IV SOLN: INTRAVENOUS | Status: DC

## 2018-01-31 MED ORDER — 0.9 % SODIUM CHLORIDE (POUR BTL) OPTIME
TOPICAL | Status: DC | PRN
  Administered 2018-01-31: 1000 mL

## 2018-01-31 MED ORDER — HYDROMORPHONE HCL 1 MG/ML IJ SOLN
1.0000 mg | Freq: Once | INTRAMUSCULAR | Status: AC
  Administered 2018-01-31: 1 mg via INTRAVENOUS
  Filled 2018-01-31: qty 1

## 2018-01-31 MED ORDER — HYDROCODONE-ACETAMINOPHEN 5-325 MG PO TABS: 1.0000 | ORAL_TABLET | Freq: Four times a day (QID) | ORAL | 0 refills | Status: DC | PRN

## 2018-01-31 MED ORDER — LIDOCAINE 2% (20 MG/ML) 5 ML SYRINGE
INTRAMUSCULAR | Status: DC | PRN
  Administered 2018-01-31: 60 mg via INTRAVENOUS

## 2018-01-31 MED ORDER — MIDAZOLAM HCL 2 MG/2ML IJ SOLN
INTRAMUSCULAR | Status: AC
  Filled 2018-01-31: qty 2

## 2018-01-31 MED ORDER — SUCCINYLCHOLINE CHLORIDE 200 MG/10ML IV SOSY
PREFILLED_SYRINGE | INTRAVENOUS | Status: AC
  Filled 2018-01-31: qty 10

## 2018-01-31 MED ORDER — HYDROMORPHONE HCL 1 MG/ML IJ SOLN
0.5000 mg | Freq: Once | INTRAMUSCULAR | Status: AC
  Administered 2018-01-31: 0.5 mg via INTRAVENOUS
  Filled 2018-01-31: qty 1

## 2018-01-31 MED ORDER — MIDAZOLAM HCL 5 MG/5ML IJ SOLN
INTRAMUSCULAR | Status: DC | PRN
  Administered 2018-01-31: 2 mg via INTRAVENOUS

## 2018-01-31 MED ORDER — METOCLOPRAMIDE HCL 5 MG/ML IJ SOLN: 10.0000 mg | Freq: Once | INTRAMUSCULAR | Status: DC | PRN

## 2018-01-31 MED ORDER — PROPOFOL 10 MG/ML IV BOLUS
INTRAVENOUS | Status: AC
  Filled 2018-01-31: qty 20

## 2018-01-31 MED ORDER — CEFAZOLIN SODIUM-DEXTROSE 2-4 GM/100ML-% IV SOLN
2.0000 g | Freq: Once | INTRAVENOUS | Status: AC
  Administered 2018-01-31: 2 g via INTRAVENOUS

## 2018-01-31 MED ORDER — LIDOCAINE 2% (20 MG/ML) 5 ML SYRINGE
INTRAMUSCULAR | Status: AC
  Filled 2018-01-31: qty 5

## 2018-01-31 MED ORDER — FENTANYL CITRATE (PF) 100 MCG/2ML IJ SOLN
INTRAMUSCULAR | Status: AC
  Filled 2018-01-31: qty 2

## 2018-01-31 MED ORDER — MEPERIDINE HCL 50 MG/ML IJ SOLN: 6.2500 mg | INTRAMUSCULAR | Status: DC | PRN

## 2018-01-31 MED ORDER — ONDANSETRON HCL 4 MG/2ML IJ SOLN
4.0000 mg | Freq: Once | INTRAMUSCULAR | Status: AC
  Administered 2018-01-31: 4 mg via INTRAVENOUS
  Filled 2018-01-31: qty 2

## 2018-01-31 MED ORDER — EPHEDRINE 5 MG/ML INJ
INTRAVENOUS | Status: AC
  Filled 2018-01-31: qty 10

## 2018-01-31 MED ORDER — CEFAZOLIN SODIUM-DEXTROSE 2-4 GM/100ML-% IV SOLN
INTRAVENOUS | Status: AC
  Filled 2018-01-31: qty 100

## 2018-01-31 MED ORDER — FENTANYL CITRATE (PF) 100 MCG/2ML IJ SOLN
INTRAMUSCULAR | Status: DC | PRN
  Administered 2018-01-31 (×2): 50 ug via INTRAVENOUS

## 2018-01-31 MED ORDER — KETOROLAC TROMETHAMINE 30 MG/ML IJ SOLN
15.0000 mg | Freq: Once | INTRAMUSCULAR | Status: AC
  Administered 2018-01-31: 15 mg via INTRAVENOUS
  Filled 2018-01-31: qty 1

## 2018-01-31 MED ORDER — CEFAZOLIN SODIUM 1 G IJ SOLR: 2.0000 g | INTRAMUSCULAR | Status: DC

## 2018-01-31 MED ORDER — FENTANYL CITRATE (PF) 100 MCG/2ML IJ SOLN: 25.0000 ug | INTRAMUSCULAR | Status: DC | PRN

## 2018-01-31 MED ORDER — LACTATED RINGERS IV SOLN
INTRAVENOUS | Status: DC | PRN
  Administered 2018-01-31: 16:00:00 via INTRAVENOUS

## 2018-01-31 MED ORDER — OXYCODONE-ACETAMINOPHEN 5-325 MG PO TABS
1.0000 | ORAL_TABLET | Freq: Once | ORAL | Status: AC
  Administered 2018-01-31: 1 via ORAL
  Filled 2018-01-31: qty 1

## 2018-01-31 MED ORDER — DEXAMETHASONE SODIUM PHOSPHATE 4 MG/ML IJ SOLN
INTRAMUSCULAR | Status: DC | PRN
  Administered 2018-01-31: 10 mg via INTRAVENOUS

## 2018-01-31 MED ORDER — PROPOFOL 10 MG/ML IV BOLUS
INTRAVENOUS | Status: DC | PRN
  Administered 2018-01-31: 150 mg via INTRAVENOUS

## 2018-01-31 MED ORDER — EPHEDRINE SULFATE-NACL 50-0.9 MG/10ML-% IV SOSY
PREFILLED_SYRINGE | INTRAVENOUS | Status: DC | PRN
  Administered 2018-01-31 (×2): 7.5 mg via INTRAVENOUS

## 2018-01-31 SURGICAL SUPPLY — 12 items
BAG URO CATCHER STRL LF (MISCELLANEOUS) ×3 IMPLANT
CATH INTERMIT  6FR 70CM (CATHETERS) ×3 IMPLANT
CLOTH BEACON ORANGE TIMEOUT ST (SAFETY) ×3 IMPLANT
COVER WAND RF STERILE (DRAPES) IMPLANT
GLOVE BIOGEL M STRL SZ7.5 (GLOVE) ×3 IMPLANT
GOWN STRL REUS W/TWL LRG LVL3 (GOWN DISPOSABLE) ×6 IMPLANT
GUIDEWIRE STR DUAL SENSOR (WIRE) ×3 IMPLANT
MANIFOLD NEPTUNE II (INSTRUMENTS) ×3 IMPLANT
PACK CYSTO (CUSTOM PROCEDURE TRAY) ×3 IMPLANT
STENT URET 6FRX24 CONTOUR (STENTS) ×3 IMPLANT
TUBING CONNECTING 10 (TUBING) ×2 IMPLANT
TUBING CONNECTING 10' (TUBING) ×1

## 2018-01-31 NOTE — Discharge Instructions (Signed)

## 2018-01-31 NOTE — Anesthesia Procedure Notes (Signed)
Procedure Name: LMA Insertion Date/Time: 01/31/2018 3:39 PM Performed by: Claudia Desanctis, CRNA Pre-anesthesia Checklist: Emergency Drugs available, Patient identified, Suction available and Patient being monitored Patient Re-evaluated:Patient Re-evaluated prior to induction Oxygen Delivery Method: Circle system utilized Preoxygenation: Pre-oxygenation with 100% oxygen Induction Type: IV induction LMA: LMA inserted LMA Size: 4.0 Number of attempts: 1 Placement Confirmation: positive ETCO2 and breath sounds checked- equal and bilateral Tube secured with: Tape Dental Injury: Teeth and Oropharynx as per pre-operative assessment

## 2018-01-31 NOTE — Progress Notes (Signed)
PACU Nursing Note: Pt ready for DC to home, VSS, Pt alert and oriented x 4, able to ambulate w/o assistance, gait very steady, tol PO fluids well and was able to void w/o incident. Pt states he feels much better. Teaching done with wife and pt, discussed safety while taking PO pain med along with General Anesthesia instructions also reviewed. Discussed when to call MD, ie. Pain unable to control, fever, unable to void or cont to void bloody urine. Also instructed importance of increasing PO fluids along with importance of handwashing. Opportunity for questions provided, Teach Back Method used. Pt escorted to exit via wc.

## 2018-01-31 NOTE — H&P (View-Only) (Signed)
Urology Consult   Physician requesting consult: Dr. Alvino Chapel  Reason for consult: Left ureteral stone  History of Present Illness: Jason Hansen. is a 67 y.o. with a history of urolithiasis with his most recent stone episode about 12 years ago when he underwent ESWL.  He developed the acute onset of left abdominal and flank pain at 3:30 AM this morning.  He had associated nausea and vomiting but no fever.  CT imaging revealed a 7 mm proximal left ureteral stone with a smaller non-obstructing left renal stone. His pain has been poorly controlled in the ER and a urology consultation was obtained.    Past Medical History:  Diagnosis Date  . Arthritis    both shoulders- aleve helps  . 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 Basal cell carcinoma nose   removed approx year 2000  . History of kidney stones    passed some stones and has stones now  . Hypertension    blood pressure has been slightly elevated at times - but lost weight - and has never required b/p meds  . Thyroid ca ([place])   . Thyroid nodule    found on CT Chest 02/28/13    Past Surgical History:  Procedure Laterality Date  . LITHOTRIPSY     pt states that this was done a long time ago  . THYROID LOBECTOMY Left 08/12/2013   Procedure: THYROID LOBECTOMY;  Surgeon: Earnstine Regal, MD;  Location: [facility];  Service: General;  Laterality: Left;    Current Hospital Medications:  Home Meds:  No current facility-administered medications on file prior to encounter.    Current Outpatient Medications on File Prior to Encounter  Medication Sig Dispense Refill  . aspirin EC 81 MG tablet Take 81 mg by mouth every evening.    Marland Kitchen atorvastatin (LIPITOR) 20 MG tablet Take 20 mg by mouth daily.        Scheduled Meds: . [START ON 02/01/2018] ceFAZolin (ANCEF) IM  2 g Intramuscular On Call to OR   Continuous Infusions: PRN Meds:.  Allergies: No Known Allergies  Family History   Problem Relation Age of Onset  . Breast cancer Mother   . Cancer Mother   . Colon cancer Father   . Breast cancer Sister     Social History:  reports that he has quit smoking. His smoking use included cigarettes. He has a 10.00 pack-year smoking history. He has never used smokeless tobacco. He reports that he drinks alcohol. He reports that he does not use drugs.  ROS: A complete review of systems was performed.  All systems are negative except for pertinent findings as noted.  Physical Exam:  Vital signs in last 24 hours: Temp:  [98.6 F (37 C)] 98.6 F (37 C) (10/13 0846) Pulse Rate:  [57-88] 88 (10/13 1400) Resp:  [16-19] 17 (10/13 1234) BP: (115-158)/(73-110) 140/85 (10/13 1400) SpO2:  [83 %-100 %] 100 % (10/13 1400) Constitutional:  Alert and oriented, No acute distress Cardiovascular: Regular rate and rhythm, No JVD Respiratory: Normal respiratory effort, Lungs clear bilaterally GI: Abdomen is soft, mild left abdominal tenderness, nondistended, no abdominal masses GU: Mild left CVA tenderness Lymphatic: No lymphadenopathy Neurologic: Grossly intact, no focal deficits Psychiatric: Normal mood and affect  Laboratory Data:  Recent Labs    01/31/18 0856 01/31/18 0915  WBC 17.7*  --   HGB 16.1 16.7  HCT 49.0 49.0  PLT 301  --  Recent Labs    01/31/18 0915  NA 145  K 4.0  CL 112*  GLUCOSE 210*  BUN 28*  CREATININE 1.20     Results for orders placed or performed during the hospital encounter of 01/31/18 (from the past 24 hour(s))  Urinalysis, Routine w reflex microscopic- may I&O cath if menses     Status: Abnormal   Collection Time: 01/31/18  8:49 AM  Result Value Ref Range   Color, Urine YELLOW (A) YELLOW   APPearance CLEAR (A) CLEAR   Specific Gravity, Urine >1.030 (H) 1.005 - 1.030   pH 5.0 5.0 - 8.0   Glucose, UA NEGATIVE NEGATIVE mg/dL   Hgb urine dipstick SMALL (A) NEGATIVE   Bilirubin Urine NEGATIVE NEGATIVE   Ketones, ur 15 (A) NEGATIVE  mg/dL   Protein, ur TRACE (A) NEGATIVE mg/dL   Nitrite NEGATIVE NEGATIVE   Leukocytes, UA NEGATIVE NEGATIVE   RBC / HPF 6-10 0 - 5 RBC/hpf   WBC, UA 0-5 0 - 5 WBC/hpf   Bacteria, UA NONE SEEN NONE SEEN   Mucus PRESENT   CBC with Differential     Status: Abnormal   Collection Time: 01/31/18  8:56 AM  Result Value Ref Range   WBC 17.7 (H) 4.0 - 10.5 K/uL   RBC 5.41 4.22 - 5.81 MIL/uL   Hemoglobin 16.1 13.0 - 17.0 g/dL   HCT 49.0 39.0 - 52.0 %   MCV 90.6 80.0 - 100.0 fL   MCH 29.8 26.0 - 34.0 pg   MCHC 32.9 30.0 - 36.0 g/dL   RDW 13.6 11.5 - 15.5 %   Platelets 301 150 - 400 K/uL   nRBC 0.0 0.0 - 0.2 %   Neutrophils Relative % 89 %   Neutro Abs 15.6 (H) 1.7 - 7.7 K/uL   Lymphocytes Relative 6 %   Lymphs Abs 1.1 0.7 - 4.0 K/uL   Monocytes Relative 4 %   Monocytes Absolute 0.7 0.1 - 1.0 K/uL   Eosinophils Relative 0 %   Eosinophils Absolute 0.0 0.0 - 0.5 K/uL   Basophils Relative 0 %   Basophils Absolute 0.1 0.0 - 0.1 K/uL   Immature Granulocytes 1 %   Abs Immature Granulocytes 0.08 (H) 0.00 - 0.07 K/uL  I-stat Chem 8, ED     Status: Abnormal   Collection Time: 01/31/18  9:15 AM  Result Value Ref Range   Sodium 145 135 - 145 mmol/L   Potassium 4.0 3.5 - 5.1 mmol/L   Chloride 112 (H) 98 - 111 mmol/L   BUN 28 (H) 8 - 23 mg/dL   Creatinine, Ser 1.20 0.61 - 1.24 mg/dL   Glucose, Bld 210 (H) 70 - 99 mg/dL   Calcium, Ion 1.16 1.15 - 1.40 mmol/L   TCO2 19 (L) 22 - 32 mmol/L   Hemoglobin 16.7 13.0 - 17.0 g/dL   HCT 49.0 39.0 - 52.0 %   No results found for this or any previous visit (from the past 240 hour(s)).  Renal Function: Recent Labs    01/31/18 0915  CREATININE 1.20   CrCl cannot be calculated (Unknown ideal weight.).  Radiologic Imaging: Ct Renal Stone Study  Result Date: 01/31/2018 CLINICAL DATA:  History of kidney stones.  Flank pain.  Vomiting. EXAM: CT ABDOMEN AND PELVIS WITHOUT CONTRAST TECHNIQUE: Multidetector CT imaging of the abdomen and pelvis was  performed following the standard protocol without IV contrast. COMPARISON:  Limited abdominal ultrasound of 11/06/2009. No prior CT. FINDINGS: Lower chest: Bibasilar volume loss and  atelectasis. Normal heart size without pericardial or pleural effusion. Multivessel coronary artery atherosclerosis. Distal esophageal mild dilatation with fluid within. Hepatobiliary: Right hepatic lobe 1.7 cm cyst. Smaller cysts and too small to characterize lesions suspected. Normal gallbladder, without biliary ductal dilatation. Pancreas: Normal, without mass or ductal dilatation. Spleen: Normal in size, without focal abnormality. Adrenals/Urinary Tract: Normal adrenal glands. Bilateral renal collecting system calculi, on the order of 5 mm and less. An exophytic interpolar left renal lesion measures 1.5 cm and 18 HU, favoring a cyst or minimally complex cyst. Minimal left-sided caliectasis with a proximal left ureteric stone of 7 mm. No bladder calculi. Stomach/Bowel: Normal stomach, without wall thickening. Scattered colonic diverticula. Normal terminal ileum and appendix. Normal small bowel. Vascular/Lymphatic: Aortic and branch vessel atherosclerosis. No abdominopelvic adenopathy. Reproductive: Mild prostatomegaly. Other: Small fat containing bilateral inguinal hernias. No significant free fluid. A tiny periumbilical fat containing ventral abdominal wall hernia. Musculoskeletal: Lumbosacral spondylosis. IMPRESSION: 1. 7 mm proximal left ureteric with mild left-sided hydronephrosis. 2. Bilateral nephrolithiasis. 3. Exophytic left renal lesion is favored to represent a cyst or minimally complex cyst. Consider renal ultrasound confirmation, non emergently. 4.  Aortic Atherosclerosis (ICD10-I70.0). 5. Esophageal fluid level suggests dysmotility or gastroesophageal reflux disease. 6. Prostatomegaly. Electronically Signed   By: Abigail Miyamoto M.D.   On: 01/31/2018 11:30    I independently reviewed the above imaging  studies.  Impression/Recommendation  Left ureteral stone with poorly controlled pain:  We discussed options including medical expulsion therapy vs intervention with a ureteral stent.  Considering his pain has not been well controlled and his elevated WBC, he has elected stent placement.  I discussed the potential benefits and risks of the procedure, side effects of the proposed treatment, the likelihood of the patient achieving the goals of the procedure, and any potential problems that might occur during the procedure or recuperation.   We also discussed options for definitive management of his stone in the future including ESWL vs ureteroscopy.  After reviewing the pros and cons of each approach, he is most interested in proceeding with left ureteroscopic laser lithotripsy in order to allow [place] to also address his other left renal stone.  This will be scheduled as an outpatient. I discussed the potential benefits and risks of the procedure, side effects of the proposed treatment, the likelihood of the patient achieving the goals of the procedure, and any potential problems that might occur during the procedure or recuperation.   Khamari Yousuf,LES 01/31/2018, 2:52 PM    [name] MD   CC: Dr. Alvino Chapel

## 2018-01-31 NOTE — Op Note (Signed)
Preoperative diagnosis:  1. Left ureteral stone   Postoperative diagnosis:  1. Left ureteral stone   Procedure:  1. Cystoscopy 2. Left ureteral stent placement (6 x 24 with no string)  Surgeon: Roxy Horseman, Brooke Bonito. M.D.  Anesthesia: General  Complications: None  EBL: Minimal  Specimens: None  Indication: Jason Hansen. is a 67 y.o. patient with a symptomatic left ureteral stone and another left renal stone. After reviewing the management options for treatment, he elected to proceed with the above surgical procedure(s). We have discussed the potential benefits and risks of the procedure, side effects of the proposed treatment, the likelihood of the patient achieving the goals of the procedure, and any potential problems that might occur during the procedure or recuperation. Informed consent has been obtained.  Description of procedure:  The patient was taken to the operating room and general anesthesia was induced.  The patient was placed in the dorsal lithotomy position, prepped and draped in the usual sterile fashion, and preoperative antibiotics were administered. A preoperative time-out was performed.   Cystourethroscopy was performed.  The patient's urethra was examined and was normal. The bladder was then systematically examined in its entirety. There was no evidence for any bladder tumors, stones, or other mucosal pathology.    Attention then turned to the left ureteral orifice and a 0.38 sensor guidewire was then advanced up the left ureter into the renal pelvis under fluoroscopic guidance.  The wire was then backloaded through the cystoscope and a ureteral stent was advance over the wire using Seldinger technique.  The stent was positioned appropriately under fluoroscopic and cystoscopic guidance.  The wire was then removed with an adequate stent curl noted in the renal pelvis as well as in the bladder.  The bladder was then emptied and the procedure ended.  The  patient appeared to tolerate the procedure well and without complications.  The patient was able to be awakened and transferred to the recovery unit in satisfactory condition.    [name] MD

## 2018-01-31 NOTE — Consult Note (Signed)
Urology Consult   Physician requesting consult: Dr. Alvino Chapel  Reason for consult: Left ureteral stone  History of Present Illness: Jason Hansen. is a 66 y.o. with a history of urolithiasis with his most recent stone episode about 12 years ago when he underwent ESWL.  He developed the acute onset of left abdominal and flank pain at 3:30 AM this morning.  He had associated nausea and vomiting but no fever.  CT imaging revealed a 7 mm proximal left ureteral stone with a smaller non-obstructing left renal stone. His pain has been poorly controlled in the ER and a urology consultation was obtained.    Past Medical History:  Diagnosis Date  . Arthritis    both shoulders- aleve helps  . 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 Basal cell carcinoma nose   removed approx year 2000  . History of kidney stones    passed some stones and has stones now  . Hypertension    blood pressure has been slightly elevated at times - but lost weight - and has never required b/p meds  . Thyroid ca ([place])   . Thyroid nodule    found on CT Chest 02/28/13    Past Surgical History:  Procedure Laterality Date  . LITHOTRIPSY     pt states that this was done a long time ago  . THYROID LOBECTOMY Left 08/12/2013   Procedure: THYROID LOBECTOMY;  Surgeon: Earnstine Regal, MD;  Location: [facility];  Service: General;  Laterality: Left;    Current Hospital Medications:  Home Meds:  No current facility-administered medications on file prior to encounter.    Current Outpatient Medications on File Prior to Encounter  Medication Sig Dispense Refill  . aspirin EC 81 MG tablet Take 81 mg by mouth every evening.    Marland Kitchen atorvastatin (LIPITOR) 20 MG tablet Take 20 mg by mouth daily.        Scheduled Meds: . [START ON 02/01/2018] ceFAZolin (ANCEF) IM  2 g Intramuscular On Call to OR   Continuous Infusions: PRN Meds:.  Allergies: No Known Allergies  Family History   Problem Relation Age of Onset  . Breast cancer Mother   . Cancer Mother   . Colon cancer Father   . Breast cancer Sister     Social History:  reports that he has quit smoking. His smoking use included cigarettes. He has a 10.00 pack-year smoking history. He has never used smokeless tobacco. He reports that he drinks alcohol. He reports that he does not use drugs.  ROS: A complete review of systems was performed.  All systems are negative except for pertinent findings as noted.  Physical Exam:  Vital signs in last 24 hours: Temp:  [98.6 F (37 C)] 98.6 F (37 C) (10/13 0846) Pulse Rate:  [57-88] 88 (10/13 1400) Resp:  [16-19] 17 (10/13 1234) BP: (115-158)/(73-110) 140/85 (10/13 1400) SpO2:  [83 %-100 %] 100 % (10/13 1400) Constitutional:  Alert and oriented, No acute distress Cardiovascular: Regular rate and rhythm, No JVD Respiratory: Normal respiratory effort, Lungs clear bilaterally GI: Abdomen is soft, mild left abdominal tenderness, nondistended, no abdominal masses GU: Mild left CVA tenderness Lymphatic: No lymphadenopathy Neurologic: Grossly intact, no focal deficits Psychiatric: Normal mood and affect  Laboratory Data:  Recent Labs    01/31/18 0856 01/31/18 0915  WBC 17.7*  --   HGB 16.1 16.7  HCT 49.0 49.0  PLT 301  --  Recent Labs    01/31/18 0915  NA 145  K 4.0  CL 112*  GLUCOSE 210*  BUN 28*  CREATININE 1.20     Results for orders placed or performed during the hospital encounter of 01/31/18 (from the past 24 hour(s))  Urinalysis, Routine w reflex microscopic- may I&O cath if menses     Status: Abnormal   Collection Time: 01/31/18  8:49 AM  Result Value Ref Range   Color, Urine YELLOW (A) YELLOW   APPearance CLEAR (A) CLEAR   Specific Gravity, Urine >1.030 (H) 1.005 - 1.030   pH 5.0 5.0 - 8.0   Glucose, UA NEGATIVE NEGATIVE mg/dL   Hgb urine dipstick SMALL (A) NEGATIVE   Bilirubin Urine NEGATIVE NEGATIVE   Ketones, ur 15 (A) NEGATIVE  mg/dL   Protein, ur TRACE (A) NEGATIVE mg/dL   Nitrite NEGATIVE NEGATIVE   Leukocytes, UA NEGATIVE NEGATIVE   RBC / HPF 6-10 0 - 5 RBC/hpf   WBC, UA 0-5 0 - 5 WBC/hpf   Bacteria, UA NONE SEEN NONE SEEN   Mucus PRESENT   CBC with Differential     Status: Abnormal   Collection Time: 01/31/18  8:56 AM  Result Value Ref Range   WBC 17.7 (H) 4.0 - 10.5 K/uL   RBC 5.41 4.22 - 5.81 MIL/uL   Hemoglobin 16.1 13.0 - 17.0 g/dL   HCT 49.0 39.0 - 52.0 %   MCV 90.6 80.0 - 100.0 fL   MCH 29.8 26.0 - 34.0 pg   MCHC 32.9 30.0 - 36.0 g/dL   RDW 13.6 11.5 - 15.5 %   Platelets 301 150 - 400 K/uL   nRBC 0.0 0.0 - 0.2 %   Neutrophils Relative % 89 %   Neutro Abs 15.6 (H) 1.7 - 7.7 K/uL   Lymphocytes Relative 6 %   Lymphs Abs 1.1 0.7 - 4.0 K/uL   Monocytes Relative 4 %   Monocytes Absolute 0.7 0.1 - 1.0 K/uL   Eosinophils Relative 0 %   Eosinophils Absolute 0.0 0.0 - 0.5 K/uL   Basophils Relative 0 %   Basophils Absolute 0.1 0.0 - 0.1 K/uL   Immature Granulocytes 1 %   Abs Immature Granulocytes 0.08 (H) 0.00 - 0.07 K/uL  I-stat Chem 8, ED     Status: Abnormal   Collection Time: 01/31/18  9:15 AM  Result Value Ref Range   Sodium 145 135 - 145 mmol/L   Potassium 4.0 3.5 - 5.1 mmol/L   Chloride 112 (H) 98 - 111 mmol/L   BUN 28 (H) 8 - 23 mg/dL   Creatinine, Ser 1.20 0.61 - 1.24 mg/dL   Glucose, Bld 210 (H) 70 - 99 mg/dL   Calcium, Ion 1.16 1.15 - 1.40 mmol/L   TCO2 19 (L) 22 - 32 mmol/L   Hemoglobin 16.7 13.0 - 17.0 g/dL   HCT 49.0 39.0 - 52.0 %   No results found for this or any previous visit (from the past 240 hour(s)).  Renal Function: Recent Labs    01/31/18 0915  CREATININE 1.20   CrCl cannot be calculated (Unknown ideal weight.).  Radiologic Imaging: Ct Renal Stone Study  Result Date: 01/31/2018 CLINICAL DATA:  History of kidney stones.  Flank pain.  Vomiting. EXAM: CT ABDOMEN AND PELVIS WITHOUT CONTRAST TECHNIQUE: Multidetector CT imaging of the abdomen and pelvis was  performed following the standard protocol without IV contrast. COMPARISON:  Limited abdominal ultrasound of 11/06/2009. No prior CT. FINDINGS: Lower chest: Bibasilar volume loss and  atelectasis. Normal heart size without pericardial or pleural effusion. Multivessel coronary artery atherosclerosis. Distal esophageal mild dilatation with fluid within. Hepatobiliary: Right hepatic lobe 1.7 cm cyst. Smaller cysts and too small to characterize lesions suspected. Normal gallbladder, without biliary ductal dilatation. Pancreas: Normal, without mass or ductal dilatation. Spleen: Normal in size, without focal abnormality. Adrenals/Urinary Tract: Normal adrenal glands. Bilateral renal collecting system calculi, on the order of 5 mm and less. An exophytic interpolar left renal lesion measures 1.5 cm and 18 HU, favoring a cyst or minimally complex cyst. Minimal left-sided caliectasis with a proximal left ureteric stone of 7 mm. No bladder calculi. Stomach/Bowel: Normal stomach, without wall thickening. Scattered colonic diverticula. Normal terminal ileum and appendix. Normal small bowel. Vascular/Lymphatic: Aortic and branch vessel atherosclerosis. No abdominopelvic adenopathy. Reproductive: Mild prostatomegaly. Other: Small fat containing bilateral inguinal hernias. No significant free fluid. A tiny periumbilical fat containing ventral abdominal wall hernia. Musculoskeletal: Lumbosacral spondylosis. IMPRESSION: 1. 7 mm proximal left ureteric with mild left-sided hydronephrosis. 2. Bilateral nephrolithiasis. 3. Exophytic left renal lesion is favored to represent a cyst or minimally complex cyst. Consider renal ultrasound confirmation, non emergently. 4.  Aortic Atherosclerosis (ICD10-I70.0). 5. Esophageal fluid level suggests dysmotility or gastroesophageal reflux disease. 6. Prostatomegaly. Electronically Signed   By: Abigail Miyamoto M.D.   On: 01/31/2018 11:30    I independently reviewed the above imaging  studies.  Impression/Recommendation  Left ureteral stone with poorly controlled pain:  We discussed options including medical expulsion therapy vs intervention with a ureteral stent.  Considering his pain has not been well controlled and his elevated WBC, he has elected stent placement.  I discussed the potential benefits and risks of the procedure, side effects of the proposed treatment, the likelihood of the patient achieving the goals of the procedure, and any potential problems that might occur during the procedure or recuperation.   We also discussed options for definitive management of his stone in the future including ESWL vs ureteroscopy.  After reviewing the pros and cons of each approach, he is most interested in proceeding with left ureteroscopic laser lithotripsy in order to allow [place] to also address his other left renal stone.  This will be scheduled as an outpatient. I discussed the potential benefits and risks of the procedure, side effects of the proposed treatment, the likelihood of the patient achieving the goals of the procedure, and any potential problems that might occur during the procedure or recuperation.   Sanjay Broadfoot,LES 01/31/2018, 2:52 PM    [name] MD   CC: Dr. Alvino Chapel

## 2018-01-31 NOTE — Anesthesia Preprocedure Evaluation (Signed)
Anesthesia Evaluation  Patient identified by MRN, date of birth, ID band Patient awake    Reviewed: Allergy & Precautions, NPO status , Patient's Chart, lab work & pertinent test results  Airway Mallampati: II  TM Distance: >3 FB Neck ROM: Full    Dental no notable dental hx.    Pulmonary former smoker,    Pulmonary exam normal breath sounds clear to auscultation       Cardiovascular hypertension, Normal cardiovascular exam Rhythm:Regular Rate:Normal     Neuro/Psych negative neurological ROS  negative psych ROS   GI/Hepatic negative GI ROS, Neg liver ROS,   Endo/Other  negative endocrine ROS  Renal/GU negative Renal ROS  negative genitourinary   Musculoskeletal negative musculoskeletal ROS (+)   Abdominal   Peds negative pediatric ROS (+)  Hematology negative hematology ROS (+)   Anesthesia Other Findings   Reproductive/Obstetrics negative OB ROS                            Anesthesia Physical Anesthesia Plan  ASA: II and emergent  Anesthesia Plan: General   Post-op Pain Management:    Induction: Intravenous  PONV Risk Score and Plan: 2 and Ondansetron and Treatment may vary due to age or medical condition  Airway Management Planned: LMA  Additional Equipment:   Intra-op Plan:   Post-operative Plan:   Informed Consent: I have reviewed the patients History and Physical, chart, labs and discussed the procedure including the risks, benefits and alternatives for the proposed anesthesia with the patient or authorized representative who has indicated his/her understanding and acceptance.   Dental advisory given  Plan Discussed with: CRNA  Anesthesia Plan Comments:         Anesthesia Quick Evaluation

## 2018-01-31 NOTE — Transfer of Care (Signed)
Immediate Anesthesia Transfer of Care Note  Patient: Jason Hansen.  Procedure(s) Performed: CYSTOSCOPY WITH RIGHT URETERAL STENT PLACEMENT (Left Ureter)  Patient Location: PACU  Anesthesia Type:General  Level of Consciousness: drowsy  Airway & Oxygen Therapy: Patient Spontanous Breathing and Patient connected to face mask  Post-op Assessment: Report given to RN and Post -op Vital signs reviewed and stable  Post vital signs: Reviewed and stable  Last Vitals:  Vitals Value Taken Time  BP    Temp    Pulse 82 01/31/2018  4:04 PM  Resp    SpO2 94 % 01/31/2018  4:04 PM  Vitals shown include unvalidated device data.  Last Pain:  Vitals:   01/31/18 1317  TempSrc:   PainSc: 3          Complications: No apparent anesthesia complications

## 2018-01-31 NOTE — ED Provider Notes (Signed)
[facility] DEPT Provider Note   CSN: [id number] Arrival date & time: 01/31/18  5726     History   Chief Complaint Chief Complaint  Patient presents with  . Pelvic Pain  . Flank Pain    HPI Jason Vera. is a 67 y.o. male.  HPI Patient presents with left flank pain going into his groin.  History of kidney stones.  States that this feels like the stones.  States last stone was around 10 years ago.  Has annual CT scans for his lungs and states they will sometimes show the stones.  Had a little bit of pain yesterday but around 3 in the morning today developed left abdominal pain.  Took a pain pill that he had and after that began to vomit.  No fevers but states he has had some chills.  No diarrhea or constipation.  Pain is sharp.  Cannot find a comfortable position. Past Medical History:  Diagnosis Date  . Arthritis    both shoulders- aleve helps  . 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 Basal cell carcinoma nose   removed approx year 2000  . History of kidney stones    passed some stones and has stones now  . Hypertension    blood pressure has been slightly elevated at times - but lost weight - and has never required b/p meds  . Thyroid ca ([place])   . Thyroid nodule    found on CT Chest 02/28/13    Patient Active Problem List   Diagnosis Date Noted  . 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 Thyroid nodule 06/16/2013    Past Surgical History:  Procedure Laterality Date  . LITHOTRIPSY     pt states that this was done a long time ago  . THYROID LOBECTOMY Left 08/12/2013   Procedure: THYROID LOBECTOMY;  Surgeon: Earnstine Regal, MD;  Location: [facility] ORS;  Service: General;  Laterality: Left;        Home Medications    Prior to Admission medications   Medication Sig Start Date End Date Taking? Authorizing Provider  aspirin EC 81 MG tablet Take 81 mg by mouth  every evening.   Yes [provider]  atorvastatin (LIPITOR) 20 MG tablet Take 20 mg by mouth daily.    Yes [provider]    Family History Family History  Problem Relation Age of Onset  . Breast cancer Mother   . Cancer Mother   . Colon cancer Father   . Breast cancer Sister     Social History Social History   Tobacco Use  . Smoking status: Former Smoker    Packs/day: 1.00    Years: 10.00    Pack years: 10.00    Types: Cigarettes  . Smokeless tobacco: Never Used  Substance Use Topics  . Alcohol use: Yes    Comment: quit smoking 34 yrs ago  alcohol occas  . Drug use: No     Allergies   Patient has no known allergies.   Review of Systems Review of Systems  Constitutional: Negative for appetite change.  HENT: Negative for congestion.   Respiratory: Negative for shortness of breath.   Cardiovascular: Negative for chest pain.  Gastrointestinal: Positive for abdominal pain.  Genitourinary: Positive for flank pain and testicular pain. Negative for discharge, dysuria and scrotal swelling.  Musculoskeletal: Negative for back pain.  Skin: Negative  for rash.  Neurological: Negative for weakness.  Psychiatric/Behavioral: Negative for confusion.     Physical Exam Updated Vital Signs BP 140/85   Pulse 88   Temp 98.6 F (37 C) (Oral)   Resp 17   SpO2 100%   Physical Exam  Constitutional: He appears well-developed.  Patient appears uncomfortable  HENT:  Head: Normocephalic.  Neck: Neck supple.  Cardiovascular: Normal rate.  Pulmonary/Chest: Effort normal.  Abdominal: There is no tenderness.  Genitourinary:  Genitourinary Comments: Some CVA tenderness on left.  Musculoskeletal: He exhibits no deformity.  Neurological: He is alert.  Skin: Skin is warm. Capillary refill takes less than 2 seconds.     ED Treatments / Results  Labs (all labs ordered are listed, but only abnormal results are displayed) Labs Reviewed  URINALYSIS, ROUTINE W  REFLEX MICROSCOPIC - Abnormal; Notable for the following components:      Result Value   Color, Urine YELLOW (*)    APPearance CLEAR (*)    Specific Gravity, Urine >1.030 (*)    Hgb urine dipstick SMALL (*)    Ketones, ur 15 (*)    Protein, ur TRACE (*)    All other components within normal limits  CBC WITH DIFFERENTIAL/PLATELET - Abnormal; Notable for the following components:   WBC 17.7 (*)    Neutro Abs 15.6 (*)    Abs Immature Granulocytes 0.08 (*)    All other components within normal limits  I-STAT CHEM 8, ED - Abnormal; Notable for the following components:   Chloride 112 (*)    BUN 28 (*)    Glucose, Bld 210 (*)    TCO2 19 (*)    All other components within normal limits    EKG None  Radiology Ct Renal Stone Study  Result Date: 01/31/2018 CLINICAL DATA:  History of kidney stones.  Flank pain.  Vomiting. EXAM: CT ABDOMEN AND PELVIS WITHOUT CONTRAST TECHNIQUE: Multidetector CT imaging of the abdomen and pelvis was performed following the standard protocol without IV contrast. COMPARISON:  Limited abdominal ultrasound of 11/06/2009. No prior CT. FINDINGS: Lower chest: Bibasilar volume loss and atelectasis. Normal heart size without pericardial or pleural effusion. Multivessel coronary artery atherosclerosis. Distal esophageal mild dilatation with fluid within. Hepatobiliary: Right hepatic lobe 1.7 cm cyst. Smaller cysts and too small to characterize lesions suspected. Normal gallbladder, without biliary ductal dilatation. Pancreas: Normal, without mass or ductal dilatation. Spleen: Normal in size, without focal abnormality. Adrenals/Urinary Tract: Normal adrenal glands. Bilateral renal collecting system calculi, on the order of 5 mm and less. An exophytic interpolar left renal lesion measures 1.5 cm and 18 HU, favoring a cyst or minimally complex cyst. Minimal left-sided caliectasis with a proximal left ureteric stone of 7 mm. No bladder calculi. Stomach/Bowel: Normal stomach,  without wall thickening. Scattered colonic diverticula. Normal terminal ileum and appendix. Normal small bowel. Vascular/Lymphatic: Aortic and branch vessel atherosclerosis. No abdominopelvic adenopathy. Reproductive: Mild prostatomegaly. Other: Small fat containing bilateral inguinal hernias. No significant free fluid. A tiny periumbilical fat containing ventral abdominal wall hernia. Musculoskeletal: Lumbosacral spondylosis. IMPRESSION: 1. 7 mm proximal left ureteric with mild left-sided hydronephrosis. 2. Bilateral nephrolithiasis. 3. Exophytic left renal lesion is favored to represent a cyst or minimally complex cyst. Consider renal ultrasound confirmation, non emergently. 4.  Aortic Atherosclerosis (ICD10-I70.0). 5. Esophageal fluid level suggests dysmotility or gastroesophageal reflux disease. 6. Prostatomegaly. Electronically Signed   By: Abigail Miyamoto M.D.   On: 01/31/2018 11:30    Procedures Procedures (including critical care time)  Medications Ordered  in ED Medications  ceFAZolin (ANCEF) 2-4 GM/100ML-% IVPB (has no administration in time range)  ceFAZolin (ANCEF) IVPB 2g/100 mL premix (has no administration in time range)  HYDROmorphone (DILAUDID) injection 0.5 mg (0.5 mg Intravenous Given 01/31/18 0906)  ondansetron (ZOFRAN) injection 4 mg (4 mg Intravenous Given 01/31/18 0905)  HYDROmorphone (DILAUDID) injection 1 mg (1 mg Intravenous Given 01/31/18 0950)  ketorolac (TORADOL) 30 MG/ML injection 15 mg (15 mg Intravenous Given 01/31/18 0949)  oxyCODONE-acetaminophen (PERCOCET/ROXICET) 5-325 MG per tablet 1 tablet (1 tablet Oral Given 01/31/18 1235)  HYDROmorphone (DILAUDID) injection 1 mg (1 mg Intravenous Given 01/31/18 1412)  ondansetron (ZOFRAN) injection 4 mg (4 mg Intravenous Given 01/31/18 1411)     Initial Impression / Assessment and Plan / ED Course  I have reviewed the triage vital signs and the nursing notes.  Pertinent labs & imaging results that were available during my  care of the patient were reviewed by me and considered in my medical decision making (see chart for details).     Patient with flank pain.  History of kidney stones.  Has large proximal stone.  Pain uncontrolled with repeated dose of Dilaudid.  Had recurrent vomiting.  Seen by Dr. Alinda Money who will take for further treatment.  Final Clinical Impressions(s) / ED Diagnoses   Final diagnoses:  Left ureteral stone    ED Discharge Orders    None       Davonna Belling, MD 01/31/18 1533

## 2018-01-31 NOTE — ED Triage Notes (Addendum)
Pt c/o flank pain radiating to lower pelvis. Pt states he has hx of kidney stones and has routine CT's that are positive for stones. Pt vomiting since this morning.

## 2018-02-01 ENCOUNTER — Encounter (HOSPITAL_COMMUNITY): Payer: Self-pay | Admitting: Urology

## 2018-02-01 ENCOUNTER — Other Ambulatory Visit: Payer: Self-pay | Admitting: Urology

## 2018-02-02 NOTE — Anesthesia Postprocedure Evaluation (Signed)
Anesthesia Post Note  Patient: Jason Hansen.  Procedure(s) Performed: CYSTOSCOPY WITH RIGHT URETERAL STENT PLACEMENT (Left Ureter)     Patient location during evaluation: PACU Anesthesia Type: General Level of consciousness: awake and alert Pain management: pain level controlled Vital Signs Assessment: post-procedure vital signs reviewed and stable Respiratory status: spontaneous breathing, nonlabored ventilation, respiratory function stable and patient connected to nasal cannula oxygen Cardiovascular status: blood pressure returned to baseline and stable Postop Assessment: no apparent nausea or vomiting Anesthetic complications: no    Last Vitals:  Vitals:   01/31/18 1630 01/31/18 1645  BP: 123/79 139/89  Pulse:    Resp:    Temp:  36.8 C  SpO2: 94% 95%    Last Pain:  Vitals:   01/31/18 1645  TempSrc:   PainSc: 0-No pain   Pain Goal:                 [name]

## 2018-02-04 ENCOUNTER — Other Ambulatory Visit: Payer: Self-pay

## 2018-02-04 ENCOUNTER — Encounter (HOSPITAL_COMMUNITY): Payer: Self-pay

## 2018-02-04 ENCOUNTER — Encounter (HOSPITAL_COMMUNITY)
Admission: RE | Admit: 2018-02-04 | Discharge: 2018-02-04 | Disposition: A | Discharge status: Home / Self Care | Payer: Medicare Other | Source: Ambulatory Visit | Attending: Urology | Admitting: Urology

## 2018-02-04 DIAGNOSIS — Z01818 Encounter for other preprocedural examination: Secondary | ICD-10-CM | POA: Insufficient documentation

## 2018-02-04 DIAGNOSIS — I10 Essential (primary) hypertension: Secondary | ICD-10-CM | POA: Diagnosis not present

## 2018-02-04 HISTORY — DX: Other specified postprocedural states: Z98.890

## 2018-02-04 HISTORY — DX: Nausea with vomiting, unspecified: R11.2

## 2018-02-04 HISTORY — DX: Adverse effect of unspecified anesthetic, initial encounter: T41.45XA

## 2018-02-04 LAB — CBC
HEMATOCRIT: 49.1 % (ref 39.0–52.0)
HEMOGLOBIN: 16.3 g/dL (ref 13.0–17.0)
MCH: 30 pg (ref 26.0–34.0)
MCHC: 33.2 g/dL (ref 30.0–36.0)
MCV: 90.3 fL (ref 80.0–100.0)
Platelets: 251 10*3/uL (ref 150–400)
RBC: 5.44 MIL/uL (ref 4.22–5.81)
RDW: 13.2 % (ref 11.5–15.5)
WBC: 6.6 10*3/uL (ref 4.0–10.5)
nRBC: 0 % (ref 0.0–0.2)

## 2018-02-04 LAB — BASIC METABOLIC PANEL
ANION GAP: 7 (ref 5–15)
BUN: 18 mg/dL (ref 8–23)
CHLORIDE: 109 mmol/L (ref 98–111)
CO2: 26 mmol/L (ref 22–32)
Calcium: 9.1 mg/dL (ref 8.9–10.3)
Creatinine, Ser: 0.98 mg/dL (ref 0.61–1.24)
GFR calc Af Amer: >60 mL/min (ref >60)
GFR calc non Af Amer: >60 mL/min (ref >60)
Glucose, Bld: 119 mg/dL — ABNORMAL HIGH (ref 70–99)
POTASSIUM: 4.4 mmol/L (ref 3.5–5.1)
SODIUM: 142 mmol/L (ref 135–145)

## 2018-02-04 NOTE — Progress Notes (Signed)
01/31/2018- noted in Epic-I stat Chem 8 and CBC w/diff., Routine U/A

## 2018-02-04 NOTE — Patient Instructions (Addendum)
Jason Hansen.  02/04/2018   Your procedure is scheduled on: Monday 02/08/2018  Report to [facility] Main  Entrance              Report to admitting at   130 PM    Call this number if you have problems the morning of surgery [phone]    Remember: Do not eat food. May have clear liquids from midnight up until 0930 am then nothing until after surgery!    CLEAR LIQUID DIET   Foods Allowed                                                                     Foods Excluded  Coffee and tea, regular and decaf                             liquids that you cannot  Plain Jell-O in any flavor                                             see through such as: Fruit ices (not with fruit pulp)                                     milk, soups, orange juice  Iced Popsicles                                    All solid food Carbonated beverages, regular and diet                                    Cranberry, grape and apple juices Sports drinks like Gatorade Lightly seasoned clear broth or consume(fat free) Sugar, honey syrup  Sample Menu Breakfast                                Lunch                                     Supper Cranberry juice                    Beef broth                            Chicken broth Jell-O                                     Grape juice  Apple juice Coffee or tea                        Jell-O                                      Popsicle                                                Coffee or tea                        Coffee or tea  _____________________________________________________________________               BRUSH YOUR TEETH MORNING OF SURGERY AND RINSE YOUR MOUTH OUT, NO CHEWING GUM CANDY OR MINTS.     Take these medicines the morning of surgery with A SIP OF WATER:  Atorvastatin (Lipitor)                                You may not have any metal on your body including hair pins and      piercings  Do not wear jewelry, make-up, lotions, powders or perfumes, deodorant                         Men may shave face and neck.   Do not bring valuables to the hospital. Jason Hansen.  Contacts, dentures or bridgework may not be worn into surgery.  Leave suitcase in the car. After surgery it may be brought to your room.     Patients discharged the day of surgery will not be allowed to drive home.  Name and phone number of your driver: spouse- Jason Hansen                Please read over the following fact sheets you were given: _____________________________________________________________________             [facility] - Preparing for Surgery Before surgery, you can play an important role.  Because skin is not sterile, your skin needs to be as free of germs as possible.  You can reduce the number of germs on your skin by washing with CHG (chlorahexidine gluconate) soap before surgery.  CHG is an antiseptic cleaner which kills germs and bonds with the skin to continue killing germs even after washing. Please DO NOT use if you have an allergy to CHG or antibacterial soaps.  If your skin becomes reddened/irritated stop using the CHG and inform your nurse when you arrive at Short Stay. Do not shave (including legs and underarms) for at least 48 hours prior to the first CHG shower.  You may shave your face/neck. Please follow these instructions carefully:  1.  Shower with CHG Soap the night before surgery and the  morning of Surgery.  2.  If you choose to wash your hair, wash your hair first as usual with your  normal  shampoo.  3.  After you shampoo, rinse your hair and body thoroughly to remove the  shampoo.  4.  Use CHG as you would any other liquid soap.  You can apply chg directly  to the skin and wash                       Gently with a scrungie or clean washcloth.  5.  Apply the CHG Soap to your body ONLY FROM  THE NECK DOWN.   Do not use on face/ open                           Wound or open sores. Avoid contact with eyes, ears mouth and genitals (private parts).                       Wash face,  Genitals (private parts) with your normal soap.             6.  Wash thoroughly, paying special attention to the area where your surgery  will be performed.  7.  Thoroughly rinse your body with warm water from the neck down.  8.  DO NOT shower/wash with your normal soap after using and rinsing off  the CHG Soap.                9.  Pat yourself dry with a clean towel.            10.  Wear clean pajamas.            11.  Place clean sheets on your bed the night of your first shower and do not  sleep with pets. Day of Surgery : Do not apply any lotions/deodorants the morning of surgery.  Please wear clean clothes to the hospital/surgery center.  FAILURE TO FOLLOW THESE INSTRUCTIONS MAY RESULT IN THE CANCELLATION OF YOUR SURGERY PATIENT SIGNATURE_________________________________  NURSE SIGNATURE__________________________________  ________________________________________________________________________

## 2018-02-08 ENCOUNTER — Ambulatory Visit (HOSPITAL_COMMUNITY): Payer: Medicare Other

## 2018-02-08 ENCOUNTER — Ambulatory Visit (HOSPITAL_COMMUNITY)
Admission: RE | Admit: 2018-02-08 | Discharge: 2018-02-08 | Disposition: A | Discharge status: Home / Self Care | Payer: Medicare Other | Source: Ambulatory Visit | Attending: Urology | Admitting: Urology

## 2018-02-08 ENCOUNTER — Ambulatory Visit (HOSPITAL_COMMUNITY): Payer: Medicare Other | Admitting: Certified Registered Nurse Anesthetist

## 2018-02-08 ENCOUNTER — Encounter (HOSPITAL_COMMUNITY)
Admission: RE | Disposition: A | Discharge status: Home / Self Care | Payer: Self-pay | Source: Ambulatory Visit | Attending: Urology

## 2018-02-08 ENCOUNTER — Other Ambulatory Visit: Payer: Self-pay

## 2018-02-08 ENCOUNTER — Encounter (HOSPITAL_COMMUNITY): Payer: Self-pay | Admitting: *Deleted

## 2018-02-08 DIAGNOSIS — N2889 Other specified disorders of kidney and ureter: Secondary | ICD-10-CM | POA: Insufficient documentation

## 2018-02-08 DIAGNOSIS — N400 Enlarged prostate without lower urinary tract symptoms: Secondary | ICD-10-CM | POA: Diagnosis not present

## 2018-02-08 DIAGNOSIS — K7689 Other specified diseases of liver: Secondary | ICD-10-CM | POA: Insufficient documentation

## 2018-02-08 DIAGNOSIS — I2510 Atherosclerotic heart disease of native coronary artery without angina pectoris: Secondary | ICD-10-CM | POA: Insufficient documentation

## 2018-02-08 DIAGNOSIS — Z87442 Personal history of urinary calculi: Secondary | ICD-10-CM | POA: Diagnosis not present

## 2018-02-08 DIAGNOSIS — Z87891 Personal history of nicotine dependence: Secondary | ICD-10-CM | POA: Insufficient documentation

## 2018-02-08 DIAGNOSIS — Z7982 Long term (current) use of aspirin: Secondary | ICD-10-CM | POA: Diagnosis not present

## 2018-02-08 DIAGNOSIS — Z79899 Other long term (current) drug therapy: Secondary | ICD-10-CM | POA: Insufficient documentation

## 2018-02-08 DIAGNOSIS — I10 Essential (primary) hypertension: Secondary | ICD-10-CM | POA: Insufficient documentation

## 2018-02-08 DIAGNOSIS — N132 Hydronephrosis with renal and ureteral calculous obstruction: Secondary | ICD-10-CM | POA: Insufficient documentation

## 2018-02-08 HISTORY — PX: CYSTOSCOPY/URETEROSCOPY/HOLMIUM LASER/STENT PLACEMENT: SHX6546

## 2018-02-08 SURGERY — CYSTOSCOPY/URETEROSCOPY/HOLMIUM LASER/STENT PLACEMENT
Anesthesia: General | Laterality: Left

## 2018-02-08 MED ORDER — DEXAMETHASONE SODIUM PHOSPHATE 10 MG/ML IJ SOLN
INTRAMUSCULAR | Status: AC
  Filled 2018-02-08: qty 1

## 2018-02-08 MED ORDER — LACTATED RINGERS IV SOLN
INTRAVENOUS | Status: DC
  Administered 2018-02-08 (×2): via INTRAVENOUS

## 2018-02-08 MED ORDER — PHENYLEPHRINE 40 MCG/ML (10ML) SYRINGE FOR IV PUSH (FOR BLOOD PRESSURE SUPPORT)
PREFILLED_SYRINGE | INTRAVENOUS | Status: DC | PRN
  Administered 2018-02-08 (×5): 80 ug via INTRAVENOUS

## 2018-02-08 MED ORDER — CEFAZOLIN SODIUM-DEXTROSE 2-4 GM/100ML-% IV SOLN
2.0000 g | INTRAVENOUS | Status: AC
  Administered 2018-02-08: 2 g via INTRAVENOUS
  Filled 2018-02-08: qty 100

## 2018-02-08 MED ORDER — DEXAMETHASONE SODIUM PHOSPHATE 10 MG/ML IJ SOLN
INTRAMUSCULAR | Status: DC | PRN
  Administered 2018-02-08: 10 mg via INTRAVENOUS

## 2018-02-08 MED ORDER — GLYCOPYRROLATE PF 0.2 MG/ML IJ SOSY
PREFILLED_SYRINGE | INTRAMUSCULAR | Status: AC
  Filled 2018-02-08: qty 1

## 2018-02-08 MED ORDER — SODIUM CHLORIDE 0.9 % IR SOLN
Status: DC | PRN
  Administered 2018-02-08: 6000 mL

## 2018-02-08 MED ORDER — MIDAZOLAM HCL 5 MG/5ML IJ SOLN
INTRAMUSCULAR | Status: DC | PRN
  Administered 2018-02-08: 2 mg via INTRAVENOUS

## 2018-02-08 MED ORDER — ONDANSETRON HCL 4 MG/2ML IJ SOLN
INTRAMUSCULAR | Status: DC | PRN
  Administered 2018-02-08 (×2): 4 mg via INTRAVENOUS

## 2018-02-08 MED ORDER — MIDAZOLAM HCL 2 MG/2ML IJ SOLN
INTRAMUSCULAR | Status: AC
  Filled 2018-02-08: qty 2

## 2018-02-08 MED ORDER — LIDOCAINE 2% (20 MG/ML) 5 ML SYRINGE
INTRAMUSCULAR | Status: AC
  Filled 2018-02-08: qty 5

## 2018-02-08 MED ORDER — LIDOCAINE 2% (20 MG/ML) 5 ML SYRINGE
INTRAMUSCULAR | Status: DC | PRN
  Administered 2018-02-08: 60 mg via INTRAVENOUS

## 2018-02-08 MED ORDER — FENTANYL CITRATE (PF) 100 MCG/2ML IJ SOLN
INTRAMUSCULAR | Status: DC | PRN
  Administered 2018-02-08 (×2): 50 ug via INTRAVENOUS

## 2018-02-08 MED ORDER — LACTATED RINGERS IV SOLN: INTRAVENOUS | Status: DC

## 2018-02-08 MED ORDER — ONDANSETRON HCL 4 MG/2ML IJ SOLN
INTRAMUSCULAR | Status: AC
  Filled 2018-02-08: qty 2

## 2018-02-08 MED ORDER — MEPERIDINE HCL 50 MG/ML IJ SOLN: 6.2500 mg | INTRAMUSCULAR | Status: DC | PRN

## 2018-02-08 MED ORDER — FENTANYL CITRATE (PF) 100 MCG/2ML IJ SOLN: 25.0000 ug | INTRAMUSCULAR | Status: DC | PRN

## 2018-02-08 MED ORDER — PROMETHAZINE HCL 25 MG/ML IJ SOLN: 6.2500 mg | INTRAMUSCULAR | Status: DC | PRN

## 2018-02-08 MED ORDER — PROPOFOL 10 MG/ML IV BOLUS
INTRAVENOUS | Status: AC
  Filled 2018-02-08: qty 20

## 2018-02-08 MED ORDER — 0.9 % SODIUM CHLORIDE (POUR BTL) OPTIME
TOPICAL | Status: DC | PRN
  Administered 2018-02-08: 1000 mL

## 2018-02-08 MED ORDER — PROPOFOL 10 MG/ML IV BOLUS
INTRAVENOUS | Status: DC | PRN
  Administered 2018-02-08: 150 mg via INTRAVENOUS

## 2018-02-08 MED ORDER — EPHEDRINE SULFATE-NACL 50-0.9 MG/10ML-% IV SOSY
PREFILLED_SYRINGE | INTRAVENOUS | Status: DC | PRN
  Administered 2018-02-08 (×5): 10 mg via INTRAVENOUS

## 2018-02-08 MED ORDER — GLYCOPYRROLATE PF 0.2 MG/ML IJ SOSY
PREFILLED_SYRINGE | INTRAMUSCULAR | Status: DC | PRN
  Administered 2018-02-08: .1 mg via INTRAVENOUS

## 2018-02-08 MED ORDER — FENTANYL CITRATE (PF) 100 MCG/2ML IJ SOLN
INTRAMUSCULAR | Status: AC
  Filled 2018-02-08: qty 2

## 2018-02-08 SURGICAL SUPPLY — 20 items
BAG URO CATCHER STRL LF (MISCELLANEOUS) ×3 IMPLANT
BASKET ZERO TIP NITINOL 2.4FR (BASKET) ×3 IMPLANT
CATH INTERMIT  6FR 70CM (CATHETERS) ×3 IMPLANT
CLOTH BEACON ORANGE TIMEOUT ST (SAFETY) ×3 IMPLANT
COVER WAND RF STERILE (DRAPES) IMPLANT
FIBER LASER FLEXIVA 365 (UROLOGICAL SUPPLIES) IMPLANT
FIBER LASER TRAC TIP (UROLOGICAL SUPPLIES) ×3 IMPLANT
GLOVE BIOGEL M STRL SZ7.5 (GLOVE) ×3 IMPLANT
GOWN STRL REUS W/TWL LRG LVL3 (GOWN DISPOSABLE) ×6 IMPLANT
GUIDEWIRE ANG ZIPWIRE 038X150 (WIRE) IMPLANT
GUIDEWIRE STR DUAL SENSOR (WIRE) ×3 IMPLANT
IV NS 1000ML (IV SOLUTION) ×2
IV NS 1000ML BAXH (IV SOLUTION) ×1 IMPLANT
MANIFOLD NEPTUNE II (INSTRUMENTS) ×3 IMPLANT
PACK CYSTO (CUSTOM PROCEDURE TRAY) ×3 IMPLANT
SHEATH URETERAL 12FRX35CM (MISCELLANEOUS) IMPLANT
STENT URET 6FRX24 CONTOUR (STENTS) ×3 IMPLANT
TUBING CONNECTING 10 (TUBING) ×2 IMPLANT
TUBING CONNECTING 10' (TUBING) ×1
TUBING UROLOGY SET (TUBING) ×3 IMPLANT

## 2018-02-08 NOTE — Op Note (Signed)
Preoperative diagnosis: Left ureteral and renal calculi  Postoperative diagnosis: Left ureteral and renal calculi  Procedure:  1. Cystoscopy 2. Left ureteroscopy and stone removal 3. Ureteroscopic laser lithotripsy 4. Left ureteral stent placement (6 x 24 with string)  Surgeon: Roxy Horseman, Brooke Bonito. M.D.  Anesthesia: General  Complications: None  EBL: Minimal  Specimens: 1. Left ureteral and renal calculi  Disposition of specimens: [facility] for stone analysis  Indication: Jason Hansen.  is a 67 y.o. patient with urolithiasis. He recently presented with a symptomatic proximal left ureteral stone and underwent stent placement for pain control. After reviewing the management options for treatment, they elected to proceed with the above surgical procedure(s). We have discussed the potential benefits and risks of the procedure, side effects of the proposed treatment, the likelihood of the patient achieving the goals of the procedure, and any potential problems that might occur during the procedure or recuperation. Informed consent has been obtained.  Description of procedure:  The patient was taken to the operating room and general anesthesia was induced.  The patient was placed in the dorsal lithotomy position, prepped and draped in the usual sterile fashion, and preoperative antibiotics were administered. A preoperative time-out was performed.   Cystourethroscopy was performed.  The patient's urethra was examined and was normal. The bladder was then systematically examined in its entirety. There was no evidence for any bladder tumors, stones, or other mucosal pathology.    Attention then turned to the left ureteral orifice and the indwelling left ureteral stent was identified and brought to the urethral meatus with flexible graspers.   A 0.38 sensor guidewire was then advanced up the left ureter into the renal pelvis under fluoroscopic guidance.  A 12/14 Fr  ureteral access sheath was then advanced over the guide wire. The digital flexible ureteroscope was then advanced through the access sheath into the ureter next to the guidewire and the calculus was identified and was located in the proximal left ureter.  The stone was pushed into the upper pole collecting system.   The stone was then fragmented with the 200 micron holmium laser fiber on a setting of 0.5 J and frequency of 20 Hz.   All sizable stones were then removed with a zero tip nitinol basket.  The other noted lower pole renal stone was then identified and was removed with the Nitinol basket.  Reinspection of the ureter/renal pelvis revealed no remaining visible stones or fragments of significant size.   The safety wire was then replaced and the access sheath removed.  The guidewire was backloaded through the cystoscope and a ureteral stent was advance over the wire using Seldinger technique.  The stent was positioned appropriately under fluoroscopic and cystoscopic guidance.  The wire was then removed with an adequate stent curl noted in the renal pelvis as well as in the bladder.  The bladder was then emptied and the procedure ended.  The patient appeared to tolerate the procedure well and without complications.  The patient was able to be awakened and transferred to the recovery unit in satisfactory condition.   [name] MD

## 2018-02-08 NOTE — Anesthesia Procedure Notes (Addendum)
Procedure Name: LMA Insertion Date/Time: 02/08/2018 9:26 AM Performed by: Mitzie Na, CRNA Pre-anesthesia Checklist: Patient identified, Emergency Drugs available, Suction available, Patient being monitored and Timeout performed Patient Re-evaluated:Patient Re-evaluated prior to induction Oxygen Delivery Method: Circle system utilized Preoxygenation: Pre-oxygenation with 100% oxygen Induction Type: IV induction LMA: LMA inserted LMA Size: 4.0 Number of attempts: 1 Placement Confirmation: positive ETCO2 and breath sounds checked- equal and bilateral Tube secured with: Tape Dental Injury: Teeth and Oropharynx as per pre-operative assessment

## 2018-02-08 NOTE — Anesthesia Preprocedure Evaluation (Addendum)
Anesthesia Evaluation  Patient identified by MRN, date of birth, ID band Patient awake    Reviewed: Allergy & Precautions, NPO status , Patient's Chart, lab work & pertinent test results  History of Anesthesia Complications (+) PONV  Airway Mallampati: I  TM Distance: >3 FB Neck ROM: Full    Dental  (+) Teeth Intact, Dental Advisory Given   Pulmonary former smoker,    breath sounds clear to auscultation       Cardiovascular hypertension,  Rhythm:Regular Rate:Normal     Neuro/Psych negative psych ROS   GI/Hepatic negative GI ROS, Neg liver ROS,   Endo/Other  negative endocrine ROS  Renal/GU negative Renal ROS     Musculoskeletal  (+) Arthritis ,   Abdominal Normal abdominal exam  (+)   Peds  Hematology negative hematology ROS (+)   Anesthesia Other Findings   Reproductive/Obstetrics                            Lab Results  Component Value Date   WBC 6.6 02/04/2018   HGB 16.3 02/04/2018   HCT 49.1 02/04/2018   MCV 90.3 02/04/2018   PLT 251 02/04/2018   Lab Results  Component Value Date   CREATININE 0.98 02/04/2018   BUN 18 02/04/2018   NA 142 02/04/2018   K 4.4 02/04/2018   CL 109 02/04/2018   CO2 26 02/04/2018   No results found for: INR, PROTIME  Anesthesia Physical Anesthesia Plan  ASA: II  Anesthesia Plan: General   Post-op Pain Management:    Induction: Intravenous  PONV Risk Score and Plan: 4 or greater and Ondansetron, Dexamethasone, Midazolam and Scopolamine patch - Pre-op  Airway Management Planned: LMA  Additional Equipment: None  Intra-op Plan:   Post-operative Plan: Extubation in OR  Informed Consent: I have reviewed the patients History and Physical, chart, labs and discussed the procedure including the risks, benefits and alternatives for the proposed anesthesia with the patient or authorized representative who has indicated his/her understanding and  acceptance.   Dental advisory given  Plan Discussed with: CRNA  Anesthesia Plan Comments:        Anesthesia Quick Evaluation

## 2018-02-08 NOTE — Anesthesia Postprocedure Evaluation (Signed)
Anesthesia Post Note  Patient: Jason Hansen.  Procedure(s) Performed: CYSTOSCOPY/URETEROSCOPY/HOLMIUM LASER/STENT PLACEMENT (Left )     Patient location during evaluation: PACU Anesthesia Type: General Level of consciousness: awake and alert Pain management: pain level controlled Vital Signs Assessment: post-procedure vital signs reviewed and stable Respiratory status: spontaneous breathing, nonlabored ventilation, respiratory function stable and patient connected to nasal cannula oxygen Cardiovascular status: blood pressure returned to baseline and stable Postop Assessment: no apparent nausea or vomiting Anesthetic complications: no    Last Vitals:  Vitals:   02/08/18 1100 02/08/18 1115  BP: 116/72 116/70  Pulse: 61 (!) 56  Resp: 13 14  Temp:  36.6 C  SpO2: 93% 94%    Last Pain:  Vitals:   02/08/18 1115  TempSrc:   PainSc: 0-No pain                 Effie Berkshire

## 2018-02-08 NOTE — Interval H&P Note (Signed)
History and Physical Interval Note:  02/08/2018 8:46 AM  Jason Hansen.  has presented today for surgery, with the diagnosis of LEFT URETERAL AND RENAL CALCULI  The various methods of treatment have been discussed with the patient and family. After consideration of risks, benefits and other options for treatment, the patient has consented to  Procedure(s) with comments: CYSTOSCOPY/URETEROSCOPY/HOLMIUM LASER/STENT PLACEMENT (Left) - 1 HR as a surgical intervention .  The patient's history has been reviewed, patient examined, no change in status, stable for surgery.  I have reviewed the patient's chart and labs.  Questions were answered to the patient's satisfaction.     Consuelo Thayne,LES

## 2018-02-08 NOTE — Transfer of Care (Signed)
Immediate Anesthesia Transfer of Care Note  Patient: Jason Hansen.  Procedure(s) Performed: CYSTOSCOPY/URETEROSCOPY/HOLMIUM LASER/STENT PLACEMENT (Left )  Patient Location: PACU  Anesthesia Type:General  Level of Consciousness: awake, alert , oriented and patient cooperative  Airway & Oxygen Therapy: Patient Spontanous Breathing and Patient connected to face mask oxygen  Post-op Assessment: Report given to RN, Post -op Vital signs reviewed and stable and Patient moving all extremities  Post vital signs: Reviewed and stable  Last Vitals:  Vitals Value Taken Time  BP 118/72 02/08/2018 10:35 AM  Temp 36.8 C 02/08/2018 10:35 AM  Pulse 64 02/08/2018 10:37 AM  Resp 12 02/08/2018 10:37 AM  SpO2 100 % 02/08/2018 10:37 AM  Vitals shown include unvalidated device data.  Last Pain:  Vitals:   02/08/18 0739  TempSrc:   PainSc: 0-No pain         Complications: No apparent anesthesia complications

## 2018-02-08 NOTE — Discharge Instructions (Signed)
1. You may see some blood in the urine and may have some burning with urination for 48-72 hours. You also may notice that you have to urinate more frequently or urgently after your procedure which is normal.  °2. You should call should you develop an inability urinate, fever > 101, persistent nausea and vomiting that prevents you from eating or drinking to stay hydrated.  °3. If you have a stent, you will likely urinate more frequently and urgently until the stent is removed and you may experience some discomfort/pain in the lower abdomen and flank especially when urinating. You may take pain medication prescribed to you if needed for pain. You may also intermittently have blood in the urine until the stent is removed. °4.   You may remove your stent on Friday morning.  Simply pull the string that is taped to your body and the stent will easily come out.  This may be best done in the shower as some urine may come out with the stent.  Usually you will feel relief once the stent is removed, but occasionally patients can develop pain due to residual swelling of the ureter that may temporarily obstruct the kidney.  This can be managed by taking pain medication and it will typically resolve with time.  Please do not hesitate to call if you have pain that is not controlled with your pain medication or does not improved within 24-48 hours. ° °

## 2018-02-09 ENCOUNTER — Encounter (HOSPITAL_COMMUNITY): Payer: Self-pay | Admitting: Urology

## 2018-03-04 ENCOUNTER — Encounter: Payer: Self-pay | Admitting: Family Medicine

## 2018-03-04 LAB — PULMONARY FUNCTION TEST

## 2018-03-29 LAB — CBC AND DIFFERENTIAL
HCT: 46 (ref 41–53)
Hemoglobin: 16 (ref 13.5–17.5)
Neutrophils Absolute: 3
Platelets: 302 (ref 150–399)
WBC: 6.4

## 2018-03-29 LAB — BASIC METABOLIC PANEL
BUN: 13 (ref 4–21)
Creatinine: 0.9 (ref 0.6–1.3)
Glucose: 112
Potassium: 4.3 (ref 3.4–5.3)
Sodium: 141 (ref 137–147)

## 2018-03-29 LAB — LIPID PANEL
Cholesterol: 165 (ref 0–200)
HDL: 62 (ref 35–70)
LDL Cholesterol: 93
Triglycerides: 48 (ref 40–160)

## 2018-03-29 LAB — HEPATIC FUNCTION PANEL
ALT: 20 (ref 10–40)
AST: 24 (ref 14–40)
Alkaline Phosphatase: 39 (ref 25–125)
Bilirubin, Total: 0.6

## 2018-03-29 LAB — PSA: PSA: 4.1

## 2018-05-24 DIAGNOSIS — Z86010 Personal history of colonic polyps: Secondary | ICD-10-CM

## 2018-05-24 DIAGNOSIS — Z860101 Personal history of adenomatous and serrated colon polyps: Secondary | ICD-10-CM

## 2018-05-24 HISTORY — DX: Personal history of colonic polyps: Z86.010

## 2018-05-24 HISTORY — DX: Personal history of adenomatous and serrated colon polyps: Z86.0101

## 2018-11-24 ENCOUNTER — Ambulatory Visit (INDEPENDENT_AMBULATORY_CARE_PROVIDER_SITE_OTHER): Payer: Medicare Other | Admitting: Family Medicine

## 2018-11-24 ENCOUNTER — Other Ambulatory Visit: Payer: Self-pay | Admitting: Family Medicine

## 2018-11-24 ENCOUNTER — Encounter: Payer: Self-pay | Admitting: Family Medicine

## 2018-11-24 VITALS — BP 144/79 | HR 82 | Temp 99.1°F | Resp 16 | Ht 65.0 in | Wt 169.8 lb

## 2018-11-24 DIAGNOSIS — R972 Elevated prostate specific antigen [PSA]: Secondary | ICD-10-CM

## 2018-11-24 DIAGNOSIS — Z9889 Other specified postprocedural states: Secondary | ICD-10-CM | POA: Diagnosis not present

## 2018-11-24 DIAGNOSIS — E7800 Pure hypercholesterolemia, unspecified: Secondary | ICD-10-CM | POA: Diagnosis not present

## 2018-11-24 DIAGNOSIS — Z125 Encounter for screening for malignant neoplasm of prostate: Secondary | ICD-10-CM

## 2018-11-24 DIAGNOSIS — R7301 Impaired fasting glucose: Secondary | ICD-10-CM

## 2018-11-24 DIAGNOSIS — R030 Elevated blood-pressure reading, without diagnosis of hypertension: Secondary | ICD-10-CM

## 2018-11-24 NOTE — Progress Notes (Signed)
Office Note 11/24/2018  CC:  Chief Complaint  Patient presents with  . Establish Care    Previous PCP, Dr.Leonard    HPI:  Jason Hansen. "Jason Hansen" is a 68 y.o. White male who is here to establish care. Patient's most recent primary MD: Dr. Edrick Oh. Old records in EPIC/[facility] EMR were reviewed prior to or during today's visit.  Feeling well, no acute complaints. Pneumovax 219.  Colonoscopy 2019. Walks 2 miles 5 d/week.  Hx of "bp up every time I'm at the doctor or dentist".  Says in the remote past he would check it himself at home and it was 120s/70s.  However, he admits he has not checked it in "a while".  Past Medical History:  Diagnosis Date  . Arthritis    both shoulders- aleve helps  . 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 Jorene Guest.  . Basal cell carcinoma nose   removed approx year 2000  . Elevated blood pressure reading without diagnosis of hypertension    blood pressure has been slightly elevated at times - but lost weight - and has never required b/p meds  . History of adenomatous polyp of colon    Multiple colonoscopies (?GI MD--need old records).  Most recent was approx 2019--? q3 or q5 yr repeat.  Marland Kitchen History of kidney stones    passed some stones and has stones now  . Hypercholesterolemia   . Thyroid nodule    nodule found on chest CT 2014.  Resected by Dr. Remer Macho lobectomy->benign    Past Surgical History:  Procedure Laterality Date  . COLONOSCOPY    . CYSTOSCOPY WITH STENT PLACEMENT Left 01/31/2018   Procedure: CYSTOSCOPY WITH RIGHT URETERAL STENT PLACEMENT;  Surgeon: Raynelle Bring, MD;  Location: [facility];  Service: Urology;  Laterality: Left;  . CYSTOSCOPY/URETEROSCOPY/HOLMIUM LASER/STENT PLACEMENT Left 02/08/2018   Procedure: CYSTOSCOPY/URETEROSCOPY/HOLMIUM LASER/STENT PLACEMENT;  Surgeon: Raynelle Bring, MD;  Location: [facility];  Service: Urology;   Laterality: Left;  1 HR  . LITHOTRIPSY     pt states that this was done a long time ago  . THYROID LOBECTOMY Left 08/12/2013   Procedure: THYROID LOBECTOMY;  Surgeon: Earnstine Regal, MD;  Location: [facility];  Service: General;  Laterality: Left;    Family History  Problem Relation Age of Onset  . Breast cancer Mother   . Cancer Mother   . Colon cancer Father   . Breast cancer Sister     Social History   Socioeconomic History  . Marital status: Married    Spouse name: Not on file  . Number of children: Not on file  . Years of education: Not on file  . Highest education level: Not on file  Occupational History  . Not on file  Social Needs  . Financial resource strain: Not on file  . Food insecurity    Worry: Not on file    Inability: Not on file  . Transportation needs    Medical: Not on file    Non-medical: Not on file  Tobacco Use  . Smoking status: Former Smoker    Packs/day: 1.00    Years: 10.00    Pack years: 10.00    Types: Cigarettes  . Smokeless tobacco: Former Systems developer    Types: Chew  Substance and Sexual Activity  . Alcohol use: Yes    Comment: quit smoking 34 yrs ago  alcohol occas;  2 beers last night 02/07/2018  . Drug use: No  . Sexual activity: Not on file  Lifestyle  . Physical activity    Days per week: Not on file    Minutes per session: Not on file  . Stress: Not on file  Relationships  . Social Herbalist on phone: Not on file    Gets together: Not on file    Attends religious service: Not on file    Active member of club or organization: Not on file    Attends meetings of clubs or organizations: Not on file    Relationship status: Not on file  . Intimate partner violence    Fear of current or ex partner: Not on file    Emotionally abused: Not on file    Physically abused: Not on file    Forced sexual activity: Not on file  Other Topics Concern  . Not on file  Social History Narrative   Married, 2 sons grown.  4 GC.   Orig from  [place].   Occup: retired from [employer] at the [employer] on [address].   No Tob.   Occ alcohol.    Outpatient Encounter Medications as of 11/24/2018  Medication Sig  . atorvastatin (LIPITOR) 20 MG tablet Take 20 mg by mouth daily.   . Coenzyme Q10 (COQ10) 200 MG CAPS Take by mouth daily.  . [DISCONTINUED] aspirin EC 81 MG tablet Take 81 mg by mouth daily.  . mometasone (NASONEX) 50 MCG/ACT nasal spray Place 2 sprays into the nose as needed.  . [DISCONTINUED] HYDROcodone-acetaminophen (NORCO/VICODIN) 5-325 MG tablet Take 1-2 tablets by mouth every 6 (six) hours as needed. (Patient not taking: Reported on 11/24/2018)   No facility-administered encounter medications on file as of 11/24/2018.     No Known Allergies  ROS Review of Systems  Constitutional: Negative for fatigue and fever.  HENT: Negative for congestion and sore throat.   Eyes: Negative for visual disturbance.  Respiratory: Negative for cough.   Cardiovascular: Negative for chest pain.  Gastrointestinal: Negative for abdominal pain and nausea.  Genitourinary: Negative for dysuria.  Musculoskeletal: Negative for back pain and joint swelling.  Skin: Negative for rash.  Neurological: Negative for weakness and headaches.  Hematological: Negative for adenopathy.    PE; Blood pressure (!) 144/79, pulse 82, temperature 99.1 F (37.3 C), temperature source Temporal, resp. rate 16, height 5\' 5"  (1.651 m), weight 169 lb 12.8 oz (77 kg), SpO2 97 %. Body mass index is 28.26 kg/m.  VITALS: Blood pressure (!) 144/79, pulse 82, temperature 99.1 F (37.3 C), temperature source Temporal, resp. rate 16, height 5\' 5"  (1.651 m), weight 169 lb 12.8 oz (77 kg), SpO2 97 %. Body mass index is 28.26 kg/m. Repeat bp was 134/88 Gen: alert, well appearing. HEENT: eyes without swelling, eryth, or drainage. Ear canals clear, TM's without erythema or loss of landmarks. Nose: clear.  Throat: no swelling, erythema, or exudate. Neck: no  adenopathy, thyromegaly, or tenderness. Lungs: CTA bilat, nonlabored resps.  CV: RRR, no m/r/g Abd: soft, NT. EXT: no edema  Pertinent labs:  Lab Results  Component Value Date   TSH 1.798 09/14/2013   Lab Results  Component Value Date   WBC 6.4 03/29/2018   HGB 16.0 03/29/2018   HCT 46 03/29/2018   MCV 90.3 02/04/2018   PLT 302 03/29/2018   Lab Results  Component Value Date   CREATININE 0.9 03/29/2018   BUN 13 03/29/2018   NA  141 03/29/2018   K 4.3 03/29/2018   CL 109 02/04/2018   CO2 26 02/04/2018   Lab Results  Component Value Date   ALT 20 03/29/2018   AST 24 03/29/2018   ALKPHOS 39 03/29/2018   BILITOT 0.5 06/17/2013   Lab Results  Component Value Date   CHOL 165 03/29/2018   Lab Results  Component Value Date   HDL 62 03/29/2018   Lab Results  Component Value Date   LDLCALC 93 03/29/2018   Lab Results  Component Value Date   TRIG 48 03/29/2018   No results found for: CHOLHDL Lab Results  Component Value Date   PSA 4.1 03/29/2018   No results found for: HGBA1C   ASSESSMENT AND PLAN:   New pt: obtain prior pcp records.  ? GI records->[facility]?  1) Hypercholesterolemia: tolerating statin.  Lipids good 03/2018. Plan repeat at next f/u here in 6 mo.  2) Elev bp w/out dx of HTN: I reiterated the need to continue occ home bp monitoring to make sure it is normal outside of medical setting. He says he will restart home monitoring.  Discussed goal bp of 130/80.  3) Primary CV prevention: he is on ASA but we discussed the emerging evidence the last few years that shows ASA not beneficial for primary prevention for CV or cerebrovasc dz. He'll d/c asa now.  4) Hx of thyroid nodule, subsequent lobectomy was benign. Last TSH normal, no thyroid supplement needed. Plan TSH monitoring at next f/u in 6 mo.  5) Hx of adenomatous colon polyps: will get records and update EMR.  An After Visit Summary was printed and given to the patient.  Return in about 6  months (around 05/27/2019) for routine chronic illness f/u--with lab appt 2 days prior.  Signed:  Crissie Sickles, MD           11/24/2018

## 2018-12-02 ENCOUNTER — Encounter: Payer: Self-pay | Admitting: Family Medicine

## 2019-01-10 ENCOUNTER — Encounter: Payer: Self-pay | Admitting: Family Medicine

## 2019-01-22 ENCOUNTER — Encounter: Payer: Self-pay | Admitting: Family Medicine

## 2019-01-25 ENCOUNTER — Telehealth: Payer: Self-pay

## 2019-01-25 DIAGNOSIS — E041 Nontoxic single thyroid nodule: Secondary | ICD-10-CM

## 2019-01-25 DIAGNOSIS — J61 Pneumoconiosis due to asbestos and other mineral fibers: Secondary | ICD-10-CM

## 2019-01-25 MED ORDER — ATORVASTATIN CALCIUM 20 MG PO TABS: 20.0000 mg | ORAL_TABLET | Freq: Every day | ORAL | 3 refills | Status: DC

## 2019-01-25 NOTE — Telephone Encounter (Signed)
Patient is new to you. Patient is requesting a refill on a medication  RF request for Atorvastatin 20mg  tab LOV: 11/24/2018 Next ov: 05/26/2019 Last written: not by you. oid pcp.

## 2019-01-25 NOTE — Telephone Encounter (Signed)
OK done

## 2019-02-20 HISTORY — PX: OTHER SURGICAL HISTORY: SHX169

## 2019-02-23 ENCOUNTER — Telehealth: Payer: Self-pay

## 2019-02-23 NOTE — Telephone Encounter (Signed)
Received voicemail from [place], house call physician who performed PAD test on both legs. Right leg was 0.33 and left was 0.38. She just called to notify PCP of results.

## 2019-03-01 ENCOUNTER — Other Ambulatory Visit: Payer: Self-pay | Admitting: Family Medicine

## 2019-03-01 DIAGNOSIS — I739 Peripheral vascular disease, unspecified: Secondary | ICD-10-CM

## 2019-03-01 NOTE — Telephone Encounter (Signed)
OK. Why did a "house call" physician come to see him?  Was it actually a physician or some time of insurer-provided testing ?   Is there anyone doing any follow-up testing for this abnormal test? -thx

## 2019-03-01 NOTE — Telephone Encounter (Signed)
Spoke with patient and the testing was insurer-provided. He was seen by PA. No f/u testing was done regarding this.

## 2019-03-01 NOTE — Telephone Encounter (Signed)
Patient was advised. He has appt tomorrow and will bring paperwork from house visit.

## 2019-03-01 NOTE — Telephone Encounter (Signed)
OK. I want to do follow up testing.  I'll repeat the test he had and if it is abnormal again I'll get ultrasound of his leg arteries. I'll enter orders now.-thx

## 2019-03-02 ENCOUNTER — Other Ambulatory Visit: Payer: Self-pay

## 2019-03-02 ENCOUNTER — Ambulatory Visit (INDEPENDENT_AMBULATORY_CARE_PROVIDER_SITE_OTHER): Payer: Medicare Other | Admitting: Family Medicine

## 2019-03-02 ENCOUNTER — Encounter: Payer: Self-pay | Admitting: Family Medicine

## 2019-03-02 VITALS — BP 127/84 | HR 75 | Temp 98.3°F | Resp 16 | Ht 65.0 in | Wt 172.8 lb

## 2019-03-02 DIAGNOSIS — R6889 Other general symptoms and signs: Secondary | ICD-10-CM | POA: Diagnosis not present

## 2019-03-02 DIAGNOSIS — R7301 Impaired fasting glucose: Secondary | ICD-10-CM | POA: Diagnosis not present

## 2019-03-02 DIAGNOSIS — I739 Peripheral vascular disease, unspecified: Secondary | ICD-10-CM

## 2019-03-02 DIAGNOSIS — Z23 Encounter for immunization: Secondary | ICD-10-CM

## 2019-03-02 DIAGNOSIS — E7800 Pure hypercholesterolemia, unspecified: Secondary | ICD-10-CM

## 2019-03-02 LAB — COMPREHENSIVE METABOLIC PANEL
ALT: 17 U/L (ref 0–53)
AST: 21 U/L (ref 0–37)
Albumin: 4.5 g/dL (ref 3.5–5.2)
Alkaline Phosphatase: 37 U/L — ABNORMAL LOW (ref 39–117)
BUN: 19 mg/dL (ref 6–23)
CO2: 25 mEq/L (ref 19–32)
Calcium: 9.1 mg/dL (ref 8.4–10.5)
Chloride: 106 mEq/L (ref 96–112)
Creatinine, Ser: 0.95 mg/dL (ref 0.40–1.50)
GFR: 78.99 mL/min (ref >60.00)
Glucose, Bld: 109 mg/dL — ABNORMAL HIGH (ref 70–99)
Potassium: 4.6 mEq/L (ref 3.5–5.1)
Sodium: 139 mEq/L (ref 135–145)
Total Bilirubin: 0.9 mg/dL (ref 0.2–1.2)
Total Protein: 6.7 g/dL (ref 6.0–8.3)

## 2019-03-02 LAB — CBC WITH DIFFERENTIAL/PLATELET
Basophils Absolute: 0.1 10*3/uL (ref 0.0–0.1)
Basophils Relative: 1.1 % (ref 0.0–3.0)
Eosinophils Absolute: 0.2 10*3/uL (ref 0.0–0.7)
Eosinophils Relative: 4 % (ref 0.0–5.0)
HCT: 47.4 % (ref 39.0–52.0)
Hemoglobin: 15.8 g/dL (ref 13.0–17.0)
Lymphocytes Relative: 31.5 % (ref 12.0–46.0)
Lymphs Abs: 1.8 10*3/uL (ref 0.7–4.0)
MCHC: 33.3 g/dL (ref 30.0–36.0)
MCV: 90.6 fl (ref 78.0–100.0)
Monocytes Absolute: 0.4 10*3/uL (ref 0.1–1.0)
Monocytes Relative: 7.7 % (ref 3.0–12.0)
Neutro Abs: 3.2 10*3/uL (ref 1.4–7.7)
Neutrophils Relative %: 55.7 % (ref 43.0–77.0)
Platelets: 260 10*3/uL (ref 150.0–400.0)
RBC: 5.23 Mil/uL (ref 4.22–5.81)
RDW: 13.7 % (ref 11.5–15.5)
WBC: 5.7 10*3/uL (ref 4.0–10.5)

## 2019-03-02 LAB — LIPID PANEL
Cholesterol: 186 mg/dL (ref 0–200)
HDL: 58.4 mg/dL (ref >39.00)
LDL Cholesterol: 116 mg/dL — ABNORMAL HIGH (ref 0–99)
NonHDL: 127.18
Total CHOL/HDL Ratio: 3
Triglycerides: 58 mg/dL (ref 0.0–149.0)
VLDL: 11.6 mg/dL (ref 0.0–40.0)

## 2019-03-02 LAB — HEMOGLOBIN A1C: Hgb A1c MFr Bld: 5.5 % (ref 4.6–6.5)

## 2019-03-02 NOTE — Addendum Note (Signed)
Addended by: Deveron Furlong D on: 03/02/2019 08:38 AM   Modules accepted: Orders

## 2019-03-02 NOTE — Progress Notes (Signed)
OFFICE VISIT  03/02/2019   CC:  Chief Complaint  Patient presents with  . circulation in feet   HPI:    Patient is a 68 y.o. Caucasian male with HLD and IFG who presents for "circualation in feet".  He had insurance representative visit his home and do ABI's recently-> Right leg was 0.33 and left was 0.38. He is active, has not pain in calves with walking.  Has had the feeling of numbness in R great toe for approx a year.  No color change or pain.  No skin changes or infection or wounds on LL's/feet/toes.   Has hx of R great toe injury many years ago. Hips bp was normal when nurse visited him and a1c was 5.3%.   Past Medical History:  Diagnosis Date  . Arthritis    both shoulders- aleve helps  . 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 Jorene Guest.  . Basal cell carcinoma nose   removed approx year 2000  . Elevated blood pressure reading without diagnosis of hypertension    blood pressure has been slightly elevated at times - but lost weight - and has never required b/p meds  . Elevated PSA    [facility]  . History of adenomatous polyp of colon 05/24/2018   Multiple colonoscopies.  Most recent 05/24/18-adenoma x 1, recall 5 yrs ([facility])  . History of kidney stones    Analysis 2019->Ca++ oxalate.  . Hypercholesterolemia   . IFG (impaired fasting glucose)    115 03/2018.    Marland Kitchen Thyroid nodule    nodule found on chest CT 2014.  Resected by Dr. Remer Macho lobectomy->benign    Past Surgical History:  Procedure Laterality Date  . COLONOSCOPY    . CYSTOSCOPY WITH STENT PLACEMENT Left 01/31/2018   Procedure: CYSTOSCOPY WITH RIGHT URETERAL STENT PLACEMENT;  Surgeon: Raynelle Bring, MD;  [facility];  Service: Urology;  Laterality: Left;  . CYSTOSCOPY/URETEROSCOPY/HOLMIUM LASER/STENT PLACEMENT Left 02/08/2018   For extraction of L ureteral stone, L ureteroscopic laser  lithotripsy, + ureteral stent placement. Procedure: CYSTOSCOPY/URETEROSCOPY/HOLMIUM LASER/STENT PLACEMENT;  Surgeon: Raynelle Bring, MD;  [facility];  Service: Urology;  Laterality: Left;  1 HR  . LITHOTRIPSY     pt states that this was done a long time ago  . THYROID LOBECTOMY Left 08/12/2013   Procedure: THYROID LOBECTOMY;  Surgeon: Earnstine Regal, MD;  [facility]: [facility];  Service: General;  Laterality: Left;    Outpatient Medications Prior to Visit  Medication Sig Dispense Refill  . atorvastatin (LIPITOR) 20 MG tablet Take 1 tablet (20 mg total) by mouth daily. 90 tablet 3  . Coenzyme Q10 (COQ10) 200 MG CAPS Take by mouth daily.    . mometasone (NASONEX) 50 MCG/ACT nasal spray Place 2 sprays into the nose as needed.     No facility-administered medications prior to visit.    Social History   Socioeconomic History  . Marital status: Married    Spouse name: Not on file  . Number of children: Not on file  . Years of education: Not on file  . Highest education level: Not on file  Occupational History  . Not on file  Social Needs  . Financial resource strain: Not on file  . Food insecurity    Worry: Not on file    Inability: Not on file  . Transportation needs    Medical: Not  on file    Non-medical: Not on file  Tobacco Use  . Smoking status: Former Smoker    Packs/day: 1.00    Years: 10.00    Pack years: 10.00    Types: Cigarettes  . Smokeless tobacco: Former Systems developer    Types: Chew  Substance and Sexual Activity  . Alcohol use: Yes    Comment: quit smoking 34 yrs ago  alcohol occas; 2 beers last night 02/07/2018  . Drug use: No  . Sexual activity: Not on file  Lifestyle  . Physical activity    Days per week: Not on file    Minutes per session: Not on file  . Stress: Not on file  Relationships  . Social Herbalist on phone: Not on file    Gets together: Not on file    Attends religious service: Not on file    Active member of club or organization: Not  on file    Attends meetings of clubs or organizations: Not on file    Relationship status: Not on file  Other Topics Concern  . Not on file  Social History Narrative   Married, 2 sons grown.  4 GC.   Orig from [place].   Occup: retired from [employer] at the [employer] on [address].   No Tob.   Occ alcohol.    No Known Allergies  ROS As per HPI  PE: Blood pressure 127/84, pulse 75, temperature 98.3 F (36.8 C), temperature source Temporal, resp. rate 16, height 5\' 5"  (1.651 m), weight 172 lb 12.8 oz (78.4 kg), SpO2 97 %. Gen: Alert, well appearing.  Patient is oriented to person, place, time, and situation. AFFECT: pleasant, lucid thought and speech. Neck: carotids 2+, w/out bruit. CV: RRR, no m/r/g.  Radial pulses 2+ bilat.  No subclavian bruits. LUNGS: CTA bilat, nonlabored resps, good aeration in all lung fields. EXT: no clubbing or cyanosis.  no edema.  No LL or feet pallor.  Temp warm.  No skin abnormalities, no loss of hair or brittle skin appearance. ROM of ankles/feet/toes intact.  NO LL, ankle, feet, or toes weakness. PT pulses palpable/normal in both LL's, but DP pulses not palpable on either side.  LABS:    Chemistry      Component Value Date/Time   NA 141 03/29/2018   K 4.3 03/29/2018   CL 109 02/04/2018 0900   CO2 26 02/04/2018 0900   BUN 13 03/29/2018   CREATININE 0.9 03/29/2018   CREATININE 0.98 02/04/2018 0900   GLU 112 03/29/2018      Component Value Date/Time   CALCIUM 9.1 02/04/2018 0900   ALKPHOS 39 03/29/2018   AST 24 03/29/2018   ALT 20 03/29/2018   BILITOT 0.5 06/17/2013 1600     Lab Results  Component Value Date   CHOL 165 03/29/2018   HDL 62 03/29/2018   LDLCALC 93 03/29/2018   TRIG 48 03/29/2018   Lab Results  Component Value Date   WBC 6.4 03/29/2018   HGB 16.0 03/29/2018   HCT 46 03/29/2018   MCV 90.3 02/04/2018   PLT 302 03/29/2018   Lab Results  Component Value Date   TSH 1.798 09/14/2013   Lab Results   Component Value Date   PSA 4.1 03/29/2018    IMPRESSION AND PLAN:  1) Abnormal screening ABI's-->done at home by nurse from [facility]. No symptoms or signs of PAD (except I can't feel DP pulses on either side). CBC, A1c (IFG), CMET, FLP (  HLD) today. Plan is to repeat ABI's in our imaging system and will do LE arterial doppler u/s if ABI's abnormal. I had d/c'd his ASA that he was on for primary CV prevention. Will hold off on restarting this unless we get objective evidence of PAD on his repeat testing coming up.  2) Preventative health care.  Pneumovax  23 today and flu vaccine today.  An After Visit Summary was printed and given to the patient.  FOLLOW UP: Return for keep appt set for 06/12/2019 with me.  Signed:  Crissie Sickles, MD           03/02/2019

## 2019-03-03 ENCOUNTER — Other Ambulatory Visit: Payer: Self-pay

## 2019-03-03 DIAGNOSIS — E041 Nontoxic single thyroid nodule: Secondary | ICD-10-CM

## 2019-03-03 DIAGNOSIS — J61 Pneumoconiosis due to asbestos and other mineral fibers: Secondary | ICD-10-CM

## 2019-03-03 MED ORDER — ATORVASTATIN CALCIUM 40 MG PO TABS: 40.0000 mg | ORAL_TABLET | Freq: Every day | ORAL | 1 refills | Status: DC

## 2019-03-10 ENCOUNTER — Encounter: Payer: Self-pay | Admitting: Family Medicine

## 2019-03-10 ENCOUNTER — Other Ambulatory Visit: Payer: Self-pay

## 2019-03-10 ENCOUNTER — Ambulatory Visit (HOSPITAL_COMMUNITY)
Admission: RE | Admit: 2019-03-10 | Discharge: 2019-03-10 | Disposition: A | Discharge status: Home / Self Care | Payer: Medicare Other | Source: Ambulatory Visit | Attending: Family Medicine | Admitting: Family Medicine

## 2019-03-10 DIAGNOSIS — I739 Peripheral vascular disease, unspecified: Secondary | ICD-10-CM | POA: Insufficient documentation

## 2019-03-10 NOTE — Progress Notes (Signed)
ABI       has been completed. Preliminary results can be found under CV proc through chart review. Edwing Figley, BS, RDMS, RVT   

## 2019-04-12 ENCOUNTER — Encounter: Payer: Self-pay | Admitting: Family Medicine

## 2019-04-12 LAB — PULMONARY FUNCTION TEST

## 2019-05-18 ENCOUNTER — Encounter: Payer: Self-pay | Admitting: Family Medicine

## 2019-05-23 ENCOUNTER — Encounter: Payer: Self-pay | Admitting: Family Medicine

## 2019-05-23 ENCOUNTER — Other Ambulatory Visit: Payer: Self-pay

## 2019-05-23 ENCOUNTER — Ambulatory Visit (INDEPENDENT_AMBULATORY_CARE_PROVIDER_SITE_OTHER): Payer: Medicare Other | Admitting: Family Medicine

## 2019-05-23 DIAGNOSIS — Z9889 Other specified postprocedural states: Secondary | ICD-10-CM

## 2019-05-23 DIAGNOSIS — R972 Elevated prostate specific antigen [PSA]: Secondary | ICD-10-CM

## 2019-05-23 DIAGNOSIS — E7800 Pure hypercholesterolemia, unspecified: Secondary | ICD-10-CM | POA: Diagnosis not present

## 2019-05-23 DIAGNOSIS — R7301 Impaired fasting glucose: Secondary | ICD-10-CM

## 2019-05-23 DIAGNOSIS — R030 Elevated blood-pressure reading, without diagnosis of hypertension: Secondary | ICD-10-CM | POA: Diagnosis not present

## 2019-05-23 LAB — COMPREHENSIVE METABOLIC PANEL
ALT: 16 U/L (ref 0–53)
AST: 17 U/L (ref 0–37)
Albumin: 4.4 g/dL (ref 3.5–5.2)
Alkaline Phosphatase: 37 U/L — ABNORMAL LOW (ref 39–117)
BUN: 16 mg/dL (ref 6–23)
CO2: 27 mEq/L (ref 19–32)
Calcium: 9 mg/dL (ref 8.4–10.5)
Chloride: 107 mEq/L (ref 96–112)
Creatinine, Ser: 0.97 mg/dL (ref 0.40–1.50)
GFR: 77.06 mL/min (ref >60.00)
Glucose, Bld: 113 mg/dL — ABNORMAL HIGH (ref 70–99)
Potassium: 4.4 mEq/L (ref 3.5–5.1)
Sodium: 142 mEq/L (ref 135–145)
Total Bilirubin: 0.5 mg/dL (ref 0.2–1.2)
Total Protein: 6.6 g/dL (ref 6.0–8.3)

## 2019-05-23 LAB — CBC WITH DIFFERENTIAL/PLATELET
Basophils Absolute: 0.1 10*3/uL (ref 0.0–0.1)
Basophils Relative: 1 % (ref 0.0–3.0)
Eosinophils Absolute: 0.4 10*3/uL (ref 0.0–0.7)
Eosinophils Relative: 6 % — ABNORMAL HIGH (ref 0.0–5.0)
HCT: 48.2 % (ref 39.0–52.0)
Hemoglobin: 16.1 g/dL (ref 13.0–17.0)
Lymphocytes Relative: 34.9 % (ref 12.0–46.0)
Lymphs Abs: 2.3 10*3/uL (ref 0.7–4.0)
MCHC: 33.5 g/dL (ref 30.0–36.0)
MCV: 89.8 fl (ref 78.0–100.0)
Monocytes Absolute: 0.5 10*3/uL (ref 0.1–1.0)
Monocytes Relative: 8 % (ref 3.0–12.0)
Neutro Abs: 3.3 10*3/uL (ref 1.4–7.7)
Neutrophils Relative %: 50.1 % (ref 43.0–77.0)
Platelets: 264 10*3/uL (ref 150.0–400.0)
RBC: 5.37 Mil/uL (ref 4.22–5.81)
RDW: 14 % (ref 11.5–15.5)
WBC: 6.5 10*3/uL (ref 4.0–10.5)

## 2019-05-23 LAB — LIPID PANEL
Cholesterol: 142 mg/dL (ref 0–200)
HDL: 54.4 mg/dL (ref >39.00)
LDL Cholesterol: 78 mg/dL (ref 0–99)
NonHDL: 87.43
Total CHOL/HDL Ratio: 3
Triglycerides: 48 mg/dL (ref 0.0–149.0)
VLDL: 9.6 mg/dL (ref 0.0–40.0)

## 2019-05-23 LAB — PSA, MEDICARE: PSA: 3.96 ng/ml (ref 0.10–4.00)

## 2019-05-23 LAB — HEMOGLOBIN A1C: Hgb A1c MFr Bld: 5.6 % (ref 4.6–6.5)

## 2019-05-23 LAB — T4, FREE: Free T4: 0.63 ng/dL (ref 0.60–1.60)

## 2019-05-23 LAB — TSH: TSH: 2.77 u[IU]/mL (ref 0.35–4.50)

## 2019-05-24 LAB — T3: T3, Total: 119 ng/dL (ref 76–181)

## 2019-05-26 ENCOUNTER — Ambulatory Visit (INDEPENDENT_AMBULATORY_CARE_PROVIDER_SITE_OTHER): Payer: Medicare Other | Admitting: Family Medicine

## 2019-05-26 ENCOUNTER — Encounter: Payer: Self-pay | Admitting: Family Medicine

## 2019-05-26 ENCOUNTER — Other Ambulatory Visit: Payer: Self-pay

## 2019-05-26 VITALS — BP 158/81 | HR 82 | Temp 98.2°F | Resp 16 | Ht 65.0 in | Wt 174.0 lb

## 2019-05-26 DIAGNOSIS — R030 Elevated blood-pressure reading, without diagnosis of hypertension: Secondary | ICD-10-CM

## 2019-05-26 DIAGNOSIS — H6123 Impacted cerumen, bilateral: Secondary | ICD-10-CM | POA: Diagnosis not present

## 2019-05-26 DIAGNOSIS — R7301 Impaired fasting glucose: Secondary | ICD-10-CM

## 2019-05-26 DIAGNOSIS — E7800 Pure hypercholesterolemia, unspecified: Secondary | ICD-10-CM | POA: Diagnosis not present

## 2019-05-26 NOTE — Progress Notes (Signed)
OFFICE VISIT  05/26/2019   CC:  Chief Complaint  Patient presents with  . Follow-up    RCI, pt is fasting    HPI:    Patient is a 69 y.o. Caucasian male who presents for 6 mo f/u chronic med issues. A/P as of 6 mo ago: "1) Hypercholesterolemia: tolerating statin.  Lipids good 03/2018. Plan repeat at next f/u here in 6 mo.  2) Elev bp w/out dx of HTN: I reiterated the need to continue occ home bp monitoring to make sure it is normal outside of medical setting. He says he will restart home monitoring.  Discussed goal bp of 130/80.  3) Primary CV prevention: he is on ASA but we discussed the emerging evidence the last few years that shows ASA not beneficial for primary prevention for CV or cerebrovasc dz. He'll d/c asa now.  4) Hx of thyroid nodule, subsequent lobectomy was benign. Last TSH normal, no thyroid supplement needed. Plan TSH monitoring at next f/u in 6 mo."  Interim hx: Reviewed all recent labs in detail today (05/23/19). Trying to walk regularly.  No acute complaints. "I've been eating poorly". He has not checked his bp at home since last visit.  He has a cuff. Tolerating atorvastatin well at 40mg  dose.  ROS: no CP, no SOB, no wheezing, no cough, no dizziness, no HAs, no rashes, no melena/hematochezia.  No polyuria or polydipsia.  No myalgias or arthralgias.  Past Medical History:  Diagnosis Date  . Arthritis    both shoulders- aleve helps  . [place]bestosis ([place])    exposed at work for [employer]- no symptoms at this time (11/2018): gets annual f/u imaging and PFTs with pulm Dr. Jorene Guest.->CT good 03/2019.  Marland Kitchen Basal cell carcinoma nose   removed approx year 2000  . Elevated blood pressure reading without diagnosis of hypertension    blood pressure has been slightly elevated at times - but lost weight - and has never required b/p meds  . Elevated PSA    [facility]  . History of adenomatous polyp of colon 05/24/2018   Multiple colonoscopies.  Most recent  05/24/18-adenoma x 1, recall 5 yrs ([facility])  . History of kidney stones    Analysis 2019->Ca++ oxalate.  . Hypercholesterolemia   . IFG (impaired fasting glucose)    115 03/2018.  A1c 5.6% and fasting gluc 112 Feb 2021.  Marland Kitchen Thyroid nodule    nodule found on chest CT 2014.  Resected by Dr. Remer Macho lobectomy->benign    Past Surgical History:  Procedure Laterality Date  . ABI's  02/2019   Bilat: NORMAL   . COLONOSCOPY    . CYSTOSCOPY WITH STENT PLACEMENT Left 01/31/2018   Procedure: CYSTOSCOPY WITH RIGHT URETERAL STENT PLACEMENT;  Surgeon: Raynelle Bring, MD;  [facility];  Service: Urology;  Laterality: Left;  . CYSTOSCOPY/URETEROSCOPY/HOLMIUM LASER/STENT PLACEMENT Left 02/08/2018   For extraction of L ureteral stone, L ureteroscopic laser lithotripsy, + ureteral stent placement. Procedure: CYSTOSCOPY/URETEROSCOPY/HOLMIUM LASER/STENT PLACEMENT;  Surgeon: Raynelle Bring, MD;  [facility];  Service: Urology;  Laterality: Left;  1 HR  . LITHOTRIPSY     pt states that this was done a long time ago  . THYROID LOBECTOMY Left 08/12/2013   Procedure: THYROID LOBECTOMY;  Surgeon: Earnstine Regal, MD;  [facility];  Service: General;  Laterality: Left;    Outpatient Medications Prior to Visit  Medication Sig Dispense Refill  . atorvastatin (LIPITOR) 40 MG tablet Take 1 tablet (40 mg total) by  mouth daily. 90 tablet 1  . Coenzyme Q10 (COQ10) 100 MG CAPS Take by mouth daily.     . mometasone (NASONEX) 50 MCG/ACT nasal spray Place 2 sprays into the nose as needed.     No facility-administered medications prior to visit.    No Known Allergies  ROS As per HPI  PE: Blood pressure (!) 158/81, pulse 82, temperature 98.2 F (36.8 C), temperature source Temporal, resp. rate 16, height 5\' 5"  (1.651 m), weight 174 lb (78.9 kg), SpO2 98 %. Body mass index is 28.96 kg/m.  Gen: Alert, well appearing.  Patient is oriented to person, place, time, and situation. AFFECT: pleasant,  lucid thought and speech. Both EACs with subtotal cerumen impactions.  I extracted all of R impaction->EAC normal and TM normal. L EAC->irrigated by nurse today. CV: RRR, no m/r/g.   LUNGS: CTA bilat, nonlabored resps, good aeration in all lung fields. EXT: no clubbing or cyanosis.  no edema.    LABS:  Lab Results  Component Value Date   TSH 2.77 05/23/2019   Lab Results  Component Value Date   WBC 6.5 05/23/2019   HGB 16.1 05/23/2019   HCT 48.2 05/23/2019   MCV 89.8 05/23/2019   PLT 264.0 05/23/2019   Lab Results  Component Value Date   CREATININE 0.97 05/23/2019   BUN 16 05/23/2019   NA 142 05/23/2019   K 4.4 05/23/2019   CL 107 05/23/2019   CO2 27 05/23/2019   Lab Results  Component Value Date   ALT 16 05/23/2019   AST 17 05/23/2019   ALKPHOS 37 (L) 05/23/2019   BILITOT 0.5 05/23/2019   Lab Results  Component Value Date   CHOL 142 05/23/2019   Lab Results  Component Value Date   HDL 54.40 05/23/2019   Lab Results  Component Value Date   LDLCALC 78 05/23/2019   Lab Results  Component Value Date   TRIG 48.0 05/23/2019   Lab Results  Component Value Date   CHOLHDL 3 05/23/2019   Lab Results  Component Value Date   PSA 3.96 05/23/2019   PSA 4.1 03/29/2018   Lab Results  Component Value Date   HGBA1C 5.6 05/23/2019   Glucose 109 05/23/19  IMPRESSION AND PLAN:  1) IFG: stable. We had long discussion about appropriate dietary and exercise changes he will need to work on. Plan repeat gluc and A1c 6 mo.  2) Hyperchol: great improvement on 40mg  qd dosing of atorva, tolerating med well. Plan recheck 6 mo.  3) Elev bp w/out dx htn: again, he needs to get into habit of checking this at home (in the past, his home checks have been ULN) so we can not run risk of under-treating.  4) Cerumen impaction bilat: R cleared 100% by me.  L irrigated by nurse.  An After Visit Summary was printed and given to the patient.  FOLLOW UP: No follow-ups on  file.  Signed:  Crissie Sickles, MD           05/26/2019

## 2019-07-19 DIAGNOSIS — U071 COVID-19: Secondary | ICD-10-CM | POA: Diagnosis not present

## 2019-07-19 DIAGNOSIS — Z03818 Encounter for observation for suspected exposure to other biological agents ruled out: Secondary | ICD-10-CM | POA: Diagnosis not present

## 2019-09-26 ENCOUNTER — Other Ambulatory Visit: Payer: Self-pay | Admitting: Family Medicine

## 2019-09-26 DIAGNOSIS — E041 Nontoxic single thyroid nodule: Secondary | ICD-10-CM

## 2019-09-26 DIAGNOSIS — J61 Pneumoconiosis due to asbestos and other mineral fibers: Secondary | ICD-10-CM

## 2019-10-28 ENCOUNTER — Telehealth: Payer: Medicare Other | Admitting: Family Medicine

## 2019-10-28 DIAGNOSIS — S80862A Insect bite (nonvenomous), left lower leg, initial encounter: Secondary | ICD-10-CM | POA: Diagnosis not present

## 2019-10-28 DIAGNOSIS — D225 Melanocytic nevi of trunk: Secondary | ICD-10-CM | POA: Diagnosis not present

## 2019-10-28 DIAGNOSIS — S80861A Insect bite (nonvenomous), right lower leg, initial encounter: Secondary | ICD-10-CM | POA: Diagnosis not present

## 2019-10-28 DIAGNOSIS — Z1283 Encounter for screening for malignant neoplasm of skin: Secondary | ICD-10-CM | POA: Diagnosis not present

## 2020-01-06 ENCOUNTER — Other Ambulatory Visit: Payer: Self-pay | Admitting: Family Medicine

## 2020-01-06 DIAGNOSIS — E041 Nontoxic single thyroid nodule: Secondary | ICD-10-CM

## 2020-01-06 DIAGNOSIS — J61 Pneumoconiosis due to asbestos and other mineral fibers: Secondary | ICD-10-CM

## 2020-01-31 ENCOUNTER — Other Ambulatory Visit: Payer: Self-pay | Admitting: Family Medicine

## 2020-01-31 DIAGNOSIS — E041 Nontoxic single thyroid nodule: Secondary | ICD-10-CM

## 2020-01-31 DIAGNOSIS — J61 Pneumoconiosis due to asbestos and other mineral fibers: Secondary | ICD-10-CM

## 2020-02-13 ENCOUNTER — Other Ambulatory Visit: Payer: Self-pay

## 2020-02-14 ENCOUNTER — Telehealth (INDEPENDENT_AMBULATORY_CARE_PROVIDER_SITE_OTHER): Payer: Medicare Other | Admitting: Family Medicine

## 2020-02-14 ENCOUNTER — Encounter: Payer: Medicare Other | Admitting: Family Medicine

## 2020-02-14 ENCOUNTER — Encounter: Payer: Self-pay | Admitting: Family Medicine

## 2020-02-14 DIAGNOSIS — J329 Chronic sinusitis, unspecified: Secondary | ICD-10-CM

## 2020-02-14 DIAGNOSIS — R0981 Nasal congestion: Secondary | ICD-10-CM | POA: Diagnosis not present

## 2020-02-14 MED ORDER — AMOXICILLIN-POT CLAVULANATE 875-125 MG PO TABS: 1.0000 | ORAL_TABLET | Freq: Two times a day (BID) | ORAL | 0 refills | Status: DC

## 2020-02-14 NOTE — Patient Instructions (Addendum)
-  I sent the medication(s) we discussed to your pharmacy: Meds ordered this encounter  Medications  . amoxicillin-clavulanate (AUGMENTIN) 875-125 MG tablet    Sig: Take 1 tablet by mouth 2 (two) times daily.    Dispense:  20 tablet    Refill:  0     I hope you are feeling better soon! Seek in person care promptly if your symptoms worsen, new concerns arise or you are not improving with treatment.    It was nice to meet you today. I help [facility] out with telemedicine visits on Tuesdays and Thursdays and am available for visits on those days. If you have any concerns or questions following this visit please schedule a follow up visit with your Primary Care doctor or seek care at a local urgent care clinic to avoid delays in care. If you are having a serious medical issue, please seek emergency care or call 911.

## 2020-02-14 NOTE — Progress Notes (Signed)
Virtual Visit via Video Note  I connected with Jason Hansen  on 02/14/20 at 11:00 AM EDT by a video enabled telemedicine application and verified that I am speaking with the correct person using two identifiers.  Location patient: home, [place] Location provider:work or home office Persons participating in the virtual visit: patient, provider  I discussed the limitations of evaluation and management by telemedicine and the availability of in person appointments. The patient expressed understanding and agreed to proceed.   HPI:  Acute telemedicine visit for Sinus Issues: -Onset: about 1 month ago -Symptoms include:sinus congestion, PND, facial discomfort -temp 97.5 -BP 113/75 -Denies: fevers, SOB, cough -Has tried: Xyzal and Nasacort -Pertinent past medical history:sinusitis, seasonal allergies -Pertinent medication allergies:denies drug allergies -COVID-19 vaccine status: fully vaccinated and had covid  ROS: See pertinent positives and negatives per HPI.  Past Medical History:  Diagnosis Date  . Arthritis    both shoulders- aleve helps  . 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 Jorene Guest.->CT good 03/2019.  Marland Kitchen Basal cell carcinoma nose   removed approx year 2000  . Carpal tunnel syndrome on both sides    wrist splints no help  . Elevated blood pressure reading without diagnosis of hypertension    blood pressure has been slightly elevated at times - but lost weight - and has never required b/p meds  . Elevated PSA    [facility]  . History of adenomatous polyp of colon 05/24/2018   Multiple colonoscopies.  Most recent 05/24/18-adenoma x 1, recall 5 yrs ([facility])  . History of kidney stones    Analysis 2019->Ca++ oxalate.  Marland Kitchen History of thyroid nodule 2014   nodule found on chest CT 2014.  Resected by Dr. Remer Macho lobectomy->benign  . Hypercholesterolemia   . IFG (impaired fasting  glucose)    115 03/2018.  A1c 5.6% and fasting gluc 112 Feb 2021.    Past Surgical History:  Procedure Laterality Date  . ABI's  02/2019   Bilat: NORMAL   . COLONOSCOPY    . CYSTOSCOPY WITH STENT PLACEMENT Left 01/31/2018   Procedure: CYSTOSCOPY WITH RIGHT URETERAL STENT PLACEMENT;  Surgeon: Raynelle Bring, MD;  Location: [facility];  Service: Urology;  Laterality: Left;  . CYSTOSCOPY/URETEROSCOPY/HOLMIUM LASER/STENT PLACEMENT Left 02/08/2018   For extraction of L ureteral stone, L ureteroscopic laser lithotripsy, + ureteral stent placement. Procedure: CYSTOSCOPY/URETEROSCOPY/HOLMIUM LASER/STENT PLACEMENT;  Surgeon: Raynelle Bring, MD;  Location: [facility];  Service: Urology;  Laterality: Left;  1 HR  . LITHOTRIPSY     pt states that this was done a long time ago  . THYROID LOBECTOMY Left 08/12/2013   Procedure: THYROID LOBECTOMY;  Surgeon: Earnstine Regal, MD;  Location: [facility];  Service: General;  Laterality: Left;     Current Outpatient Medications:  .  atorvastatin (LIPITOR) 40 MG tablet, TAKE 1 TABLET DAILY, Disp: 30 tablet, Rfl: 0 .  Coenzyme Q10 (COQ10) 100 MG CAPS, Take by mouth daily. , Disp: , Rfl:  .  triamcinolone (NASACORT ALLERGY 24HR) 55 MCG/ACT AERO nasal inhaler, Place 2 sprays into the nose daily., Disp: , Rfl:  .  amoxicillin-clavulanate (AUGMENTIN) 875-125 MG tablet, Take 1 tablet by mouth 2 (two) times daily., Disp: 20 tablet, Rfl: 0  EXAM:  VITALS per patient if applicable:  GENERAL: alert, oriented, appears well and in no acute distress  HEENT: atraumatic, conjunttiva clear, no obvious abnormalities on inspection of  external nose and ears  NECK: normal movements of the head and neck  LUNGS: on inspection no signs of respiratory distress, breathing rate appears normal, no obvious gross SOB, gasping or wheezing  CV: no obvious cyanosis  MS: moves all visible extremities without noticeable abnormality  PSYCH/NEURO: pleasant and cooperative, no obvious depression or  anxiety, speech and thought processing grossly intact  ASSESSMENT AND PLAN:  Discussed the following assessment and plan:  Sinusitis, unspecified chronicity, unspecified location  Sinus congestion  -we discussed possible serious and likely etiologies, options for evaluation and workup, limitations of telemedicine visit vs in person visit, treatment, treatment risks and precautions. Pt prefers to treat via telemedicine empirically rather than in person at this moment. Given duration of symptoms with sins discomfort he opted for empiric treatment with Augmentin 875 bid x 7-10 days for possible sinusitis w/ continuation of allergy regimen.  Advised to seek prompt in person care if worsening, new symptoms arise, or if is not improving with treatment. Discussed options for inperson care if PCP office not available. Did let this patient know that I only do telemedicine on Tuesdays and Thursdays for [facility]. Advised to schedule follow up visit with PCP or [facility] if any further questions or concerns to avoid delays in care.   I discussed the assessment and treatment plan with the patient. The patient was provided an opportunity to ask questions and all were answered. The patient agreed with the plan and demonstrated an understanding of the instructions.     Lucretia Kern, DO

## 2020-02-24 ENCOUNTER — Telehealth: Payer: Self-pay

## 2020-02-24 NOTE — Telephone Encounter (Signed)
Patient had mychart virtual visit with Dr. Colin Benton on 10/26. Patient received "no show" letter from her office.    Patient was seen and treated by Dr. Maudie Mercury per clinic notes.  He was not too happy about receiving a letter about a no show visit that obviously he was in attendance via his mychart.  Please make sure he does not get charged for this, and if he does, please correct.  I calmed him down at the end and he was okay.  Thank you, Hinton Dyer

## 2020-03-05 ENCOUNTER — Other Ambulatory Visit: Payer: Self-pay | Admitting: Family Medicine

## 2020-03-05 DIAGNOSIS — E041 Nontoxic single thyroid nodule: Secondary | ICD-10-CM

## 2020-03-05 DIAGNOSIS — J61 Pneumoconiosis due to asbestos and other mineral fibers: Secondary | ICD-10-CM

## 2020-03-07 NOTE — Telephone Encounter (Signed)
Pt will not be charged for no show and the visit have been corrected.

## 2020-03-12 ENCOUNTER — Other Ambulatory Visit: Payer: Self-pay

## 2020-03-13 ENCOUNTER — Other Ambulatory Visit: Payer: Self-pay

## 2020-03-14 ENCOUNTER — Encounter: Payer: Self-pay | Admitting: Family Medicine

## 2020-03-14 ENCOUNTER — Ambulatory Visit (INDEPENDENT_AMBULATORY_CARE_PROVIDER_SITE_OTHER): Payer: Medicare Other | Admitting: Family Medicine

## 2020-03-14 VITALS — BP 149/80 | HR 75 | Temp 97.9°F | Resp 16 | Ht 65.0 in | Wt 171.6 lb

## 2020-03-14 DIAGNOSIS — R030 Elevated blood-pressure reading, without diagnosis of hypertension: Secondary | ICD-10-CM | POA: Diagnosis not present

## 2020-03-14 DIAGNOSIS — Z9009 Acquired absence of other part of head and neck: Secondary | ICD-10-CM | POA: Diagnosis not present

## 2020-03-14 DIAGNOSIS — Z125 Encounter for screening for malignant neoplasm of prostate: Secondary | ICD-10-CM | POA: Diagnosis not present

## 2020-03-14 DIAGNOSIS — E7800 Pure hypercholesterolemia, unspecified: Secondary | ICD-10-CM | POA: Diagnosis not present

## 2020-03-14 DIAGNOSIS — Z23 Encounter for immunization: Secondary | ICD-10-CM

## 2020-03-14 DIAGNOSIS — Z0000 Encounter for general adult medical examination without abnormal findings: Secondary | ICD-10-CM

## 2020-03-14 DIAGNOSIS — R972 Elevated prostate specific antigen [PSA]: Secondary | ICD-10-CM | POA: Diagnosis not present

## 2020-03-14 DIAGNOSIS — R7301 Impaired fasting glucose: Secondary | ICD-10-CM | POA: Diagnosis not present

## 2020-03-14 LAB — CBC WITH DIFFERENTIAL/PLATELET
Basophils Absolute: 0.1 10*3/uL (ref 0.0–0.1)
Basophils Relative: 0.9 % (ref 0.0–3.0)
Eosinophils Absolute: 0.2 10*3/uL (ref 0.0–0.7)
Eosinophils Relative: 3 % (ref 0.0–5.0)
HCT: 49.2 % (ref 39.0–52.0)
Hemoglobin: 16.3 g/dL (ref 13.0–17.0)
Lymphocytes Relative: 24.6 % (ref 12.0–46.0)
Lymphs Abs: 1.6 10*3/uL (ref 0.7–4.0)
MCHC: 33.2 g/dL (ref 30.0–36.0)
MCV: 89.1 fl (ref 78.0–100.0)
Monocytes Absolute: 0.5 10*3/uL (ref 0.1–1.0)
Monocytes Relative: 8.3 % (ref 3.0–12.0)
Neutro Abs: 4.1 10*3/uL (ref 1.4–7.7)
Neutrophils Relative %: 63.2 % (ref 43.0–77.0)
Platelets: 256 10*3/uL (ref 150.0–400.0)
RBC: 5.52 Mil/uL (ref 4.22–5.81)
RDW: 13.8 % (ref 11.5–15.5)
WBC: 6.4 10*3/uL (ref 4.0–10.5)

## 2020-03-14 LAB — PSA, MEDICARE: PSA: 4.23 ng/ml — ABNORMAL HIGH (ref 0.10–4.00)

## 2020-03-14 LAB — COMPREHENSIVE METABOLIC PANEL
ALT: 21 U/L (ref 0–53)
AST: 21 U/L (ref 0–37)
Albumin: 4.5 g/dL (ref 3.5–5.2)
Alkaline Phosphatase: 43 U/L (ref 39–117)
BUN: 22 mg/dL (ref 6–23)
CO2: 28 mEq/L (ref 19–32)
Calcium: 9.4 mg/dL (ref 8.4–10.5)
Chloride: 104 mEq/L (ref 96–112)
Creatinine, Ser: 1.07 mg/dL (ref 0.40–1.50)
GFR: 71.35 mL/min (ref >60.00)
Glucose, Bld: 108 mg/dL — ABNORMAL HIGH (ref 70–99)
Potassium: 4.6 mEq/L (ref 3.5–5.1)
Sodium: 140 mEq/L (ref 135–145)
Total Bilirubin: 0.7 mg/dL (ref 0.2–1.2)
Total Protein: 6.8 g/dL (ref 6.0–8.3)

## 2020-03-14 LAB — HEMOGLOBIN A1C: Hgb A1c MFr Bld: 5.6 % (ref 4.6–6.5)

## 2020-03-14 LAB — LIPID PANEL
Cholesterol: 151 mg/dL (ref 0–200)
HDL: 56.4 mg/dL (ref >39.00)
LDL Cholesterol: 86 mg/dL (ref 0–99)
NonHDL: 94.93
Total CHOL/HDL Ratio: 3
Triglycerides: 43 mg/dL (ref 0.0–149.0)
VLDL: 8.6 mg/dL (ref 0.0–40.0)

## 2020-03-14 LAB — TSH: TSH: 2.59 u[IU]/mL (ref 0.35–4.50)

## 2020-03-14 NOTE — Progress Notes (Signed)
Office Note 03/14/2020  CC:  Chief Complaint  Patient presents with  . Annual Exam    pt is fasting    HPI:  Tell Jason Hansen. is a 69 y.o. White male who is here for annual health maintenance exam and f/u HLD, IFG, and elev bp w/out dx HTN. A/P as of last visit: "1) IFG: stable. We had long discussion about appropriate dietary and exercise changes he will need to work on. Plan repeat gluc and A1c 6 mo.  2) Hyperchol: great improvement on 40mg  qd dosing of atorva, tolerating med well. Plan recheck 6 mo.  3) Elev bp w/out dx htn: again, he needs to get into habit of checking this at home (in the past, his home checks have been ULN) so we can not run risk of under-treating.  4) Cerumen impaction bilat: R cleared 100% by me.  L irrigated by nurse."  INTERIM HX: Feeling fine. He's walking daily, playing pickle ball. Healthy diet.  Home bp's: 130 max syst, diast 70s.    HLD: taking statin daily.    Past Medical History:  Diagnosis Date  . Arthritis    both shoulders- aleve helps  . 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 Jorene Guest.->CT good 03/2019.  Marland Kitchen Basal cell carcinoma nose   removed approx year 2000  . Carpal tunnel syndrome on both sides    wrist splints no help  . Elevated blood pressure reading without diagnosis of hypertension    blood pressure has been slightly elevated at times - but lost weight - and has never required b/p meds  . Elevated PSA    [facility]  . History of adenomatous polyp of colon 05/24/2018   Multiple colonoscopies.  Most recent 05/24/18-adenoma x 1, recall 5 yrs ([facility])  . History of kidney stones    Analysis 2019->Ca++ oxalate.  Marland Kitchen History of thyroid nodule 2014   nodule found on chest CT 2014.  Resected by Dr. Remer Macho lobectomy->benign  . Hypercholesterolemia   . IFG (impaired fasting glucose)    115 03/2018.  A1c 5.6%  and fasting gluc 112 Feb 2021.    Past Surgical History:  Procedure Laterality Date  . ABI's  02/2019   Bilat: NORMAL   . COLONOSCOPY    . CYSTOSCOPY WITH STENT PLACEMENT Left 01/31/2018   Procedure: CYSTOSCOPY WITH RIGHT URETERAL STENT PLACEMENT;  Surgeon: Raynelle Bring, MD;  [facility];  Service: Urology;  Laterality: Left;  . CYSTOSCOPY/URETEROSCOPY/HOLMIUM LASER/STENT PLACEMENT Left 02/08/2018   For extraction of L ureteral stone, L ureteroscopic laser lithotripsy, + ureteral stent placement. Procedure: CYSTOSCOPY/URETEROSCOPY/HOLMIUM LASER/STENT PLACEMENT;  Surgeon: Raynelle Bring, MD;  [facility];  Service: Urology;  Laterality: Left;  1 HR  . LITHOTRIPSY     pt states that this was done a long time ago  . THYROID LOBECTOMY Left 08/12/2013   Procedure: THYROID LOBECTOMY;  Surgeon: Earnstine Regal, MD;  [facility];  Service: General;  Laterality: Left;    Family History  Problem Relation Age of Onset  . Breast cancer Mother   . Cancer Mother   . Colon cancer Father   . Breast cancer Sister     Social History   Socioeconomic History  . Marital status: Married    Spouse name: Not on file  . Number of children: Not on file  . Years of education: Not on  file  . Highest education level: Not on file  Occupational History  . Not on file  Tobacco Use  . Smoking status: Former Smoker    Packs/day: 1.00    Years: 10.00    Pack years: 10.00    Types: Cigarettes  . Smokeless tobacco: Former Systems developer    Types: Secondary school teacher  . Vaping Use: Never used  Substance and Sexual Activity  . Alcohol use: Yes    Comment: quit smoking 34 yrs ago  alcohol occas; 2 beers last night 02/07/2018  . Drug use: No  . Sexual activity: Not on file  Other Topics Concern  . Not on file  Social History Narrative   Married, 2 sons grown.  4 GC.   Orig from [place].   Occup: retired from [employer] at the [employer] on [address].   No Tob.   Occ alcohol.   Social  Determinants of Health   Financial Resource Strain:   . Difficulty of Paying Living Expenses: Not on file  Food Insecurity:   . Worried About Charity fundraiser in the Last Year: Not on file  . Ran Out of Food in the Last Year: Not on file  Transportation Needs:   . Lack of Transportation (Medical): Not on file  . Lack of Transportation (Non-Medical): Not on file  Physical Activity:   . Days of Exercise per Week: Not on file  . Minutes of Exercise per Session: Not on file  Stress:   . Feeling of Stress : Not on file  Social Connections:   . Frequency of Communication with Friends and Family: Not on file  . Frequency of Social Gatherings with Friends and Family: Not on file  . Attends Religious Services: Not on file  . Active Member of Clubs or Organizations: Not on file  . Attends Archivist Meetings: Not on file  . Marital Status: Not on file  Intimate Partner Violence:   . Fear of Current or Ex-Partner: Not on file  . Emotionally Abused: Not on file  . Physically Abused: Not on file  . Sexually Abused: Not on file    Outpatient Medications Prior to Visit  Medication Sig Dispense Refill  . atorvastatin (LIPITOR) 40 MG tablet TAKE 1 TABLET DAILY 30 tablet 0  . Coenzyme Q10 (COQ10) 100 MG CAPS Take by mouth daily.     Marland Kitchen augmented betamethasone dipropionate (DIPROLENE-AF) 0.05 % cream Apply topically 2 (two) times daily as needed. (Patient not taking: Reported on 03/14/2020)    . triamcinolone (NASACORT ALLERGY 24HR) 55 MCG/ACT AERO nasal inhaler Place 2 sprays into the nose daily. (Patient not taking: Reported on 03/14/2020)    . amoxicillin-clavulanate (AUGMENTIN) 875-125 MG tablet Take 1 tablet by mouth 2 (two) times daily. (Patient not taking: Reported on 03/14/2020) 20 tablet 0   No facility-administered medications prior to visit.    No Known Allergies  ROS Review of Systems  Constitutional: Negative for appetite change, chills, fatigue and fever.  HENT:  Negative for congestion, dental problem, ear pain and sore throat.   Eyes: Negative for discharge, redness and visual disturbance.  Respiratory: Negative for cough, chest tightness, shortness of breath and wheezing.   Cardiovascular: Negative for chest pain, palpitations and leg swelling.  Gastrointestinal: Negative for abdominal pain, blood in stool, diarrhea, nausea and vomiting.  Genitourinary: Negative for difficulty urinating, dysuria, flank pain, frequency, hematuria and urgency.  Musculoskeletal: Negative for arthralgias, back pain, joint swelling, myalgias and neck  stiffness.  Skin: Negative for pallor and rash.  Neurological: Negative for dizziness, speech difficulty, weakness and headaches.  Hematological: Negative for adenopathy. Does not bruise/bleed easily.  Psychiatric/Behavioral: Negative for confusion and sleep disturbance. The patient is not nervous/anxious.     PE; Vitals with BMI 03/14/2020 05/26/2019 03/02/2019  Height 5\' 5"  5\' 5"  5\' 5"   Weight 171 lbs 10 oz 174 lbs 172 lbs 13 oz  BMI 28.56 62.37 62.83  Systolic 151 761 607  Diastolic 80 81 84  Pulse 75 82 75     Gen: Alert, well appearing.  Patient is oriented to person, place, time, and situation. AFFECT: pleasant, lucid thought and speech. ENT: Ears: EACs clear, normal epithelium.  TMs with good light reflex and landmarks bilaterally.  Eyes: no injection, icteris, swelling, or exudate.  EOMI, PERRLA. Nose: no drainage or turbinate edema/swelling.  No injection or focal lesion.  Mouth: lips without lesion/swelling.  Oral mucosa pink and moist.  Dentition intact and without obvious caries or gingival swelling.  Oropharynx without erythema, exudate, or swelling.  Neck: supple/nontender.  No LAD, mass, or TM.  Carotid pulses 2+ bilaterally, without bruits. CV: RRR, no m/r/g.   LUNGS: CTA bilat, nonlabored resps, good aeration in all lung fields. ABD: soft, NT, ND, BS normal.  No hepatospenomegaly or mass.  No  bruits. EXT: no clubbing, cyanosis, or edema.  Musculoskeletal: no joint swelling, erythema, warmth, or tenderness.  ROM of all joints intact. Skin - no sores or suspicious lesions or rashes or color changes   Pertinent labs:  Lab Results  Component Value Date   TSH 2.77 05/23/2019   Lab Results  Component Value Date   WBC 6.5 05/23/2019   HGB 16.1 05/23/2019   HCT 48.2 05/23/2019   MCV 89.8 05/23/2019   PLT 264.0 05/23/2019   Lab Results  Component Value Date   CREATININE 0.97 05/23/2019   BUN 16 05/23/2019   NA 142 05/23/2019   K 4.4 05/23/2019   CL 107 05/23/2019   CO2 27 05/23/2019   Lab Results  Component Value Date   ALT 16 05/23/2019   AST 17 05/23/2019   ALKPHOS 37 (L) 05/23/2019   BILITOT 0.5 05/23/2019   Lab Results  Component Value Date   CHOL 142 05/23/2019   Lab Results  Component Value Date   HDL 54.40 05/23/2019   Lab Results  Component Value Date   LDLCALC 78 05/23/2019   Lab Results  Component Value Date   TRIG 48.0 05/23/2019   Lab Results  Component Value Date   CHOLHDL 3 05/23/2019   Lab Results  Component Value Date   PSA 3.96 05/23/2019   PSA 4.1 03/29/2018   Lab Results  Component Value Date   HGBA1C 5.6 05/23/2019    ASSESSMENT AND PLAN:   1) HLD: tolerating statin, diet and exercise level good. Cont atorva 40mg  qd. FLP and hepatic panel today.  2) Elev bp w/out dx htn: continue periodic home bp check.  3) IFG: diet healthy, exercises regularly. Fasting gluc and Hba1c today.  4) Health maintenance exam: Reviewed age and gender appropriate health maintenance issues (prudent diet, regular exercise, health risks of tobacco and excessive alcohol, use of seatbelts, fire alarms in home, use of sunscreen).  Also reviewed age and gender appropriate health screening as well as vaccine recommendations. Vaccines: Flu->given today.  Shingrix->discussed, will send rx to pharmacy.  Otherwise all UTD. Labs: fasting HP,  a1c. Prostate ca screening: hx of elev PSA->Urol followed up  until pandemic hit--his last visit with Dr. Alinda Money was 2019--check PSA today. Colon ca screening: hx of polyps, next colonoscopy due 05/2023.  An After Visit Summary was printed and given to the patient.  FOLLOW UP:  Return in about 6 months (around 09/11/2020) for routine chronic illness f/u.  Signed:  Crissie Sickles, MD           03/14/2020

## 2020-03-14 NOTE — Patient Instructions (Addendum)

## 2020-03-16 ENCOUNTER — Encounter: Payer: Self-pay | Admitting: Family Medicine

## 2020-04-04 ENCOUNTER — Other Ambulatory Visit: Payer: Self-pay | Admitting: Family Medicine

## 2020-04-04 DIAGNOSIS — J61 Pneumoconiosis due to asbestos and other mineral fibers: Secondary | ICD-10-CM

## 2020-04-04 DIAGNOSIS — E041 Nontoxic single thyroid nodule: Secondary | ICD-10-CM

## 2020-05-08 ENCOUNTER — Other Ambulatory Visit: Payer: Self-pay | Admitting: Family Medicine

## 2020-05-08 DIAGNOSIS — E041 Nontoxic single thyroid nodule: Secondary | ICD-10-CM

## 2020-05-08 DIAGNOSIS — J61 Pneumoconiosis due to asbestos and other mineral fibers: Secondary | ICD-10-CM

## 2020-05-28 ENCOUNTER — Other Ambulatory Visit: Payer: Self-pay | Admitting: Family Medicine

## 2020-05-28 DIAGNOSIS — E041 Nontoxic single thyroid nodule: Secondary | ICD-10-CM

## 2020-05-28 DIAGNOSIS — J61 Pneumoconiosis due to asbestos and other mineral fibers: Secondary | ICD-10-CM

## 2020-08-13 ENCOUNTER — Other Ambulatory Visit: Payer: Self-pay | Admitting: Family Medicine

## 2020-08-13 DIAGNOSIS — E041 Nontoxic single thyroid nodule: Secondary | ICD-10-CM

## 2020-08-13 DIAGNOSIS — J61 Pneumoconiosis due to asbestos and other mineral fibers: Secondary | ICD-10-CM

## 2020-08-19 DIAGNOSIS — H10013 Acute follicular conjunctivitis, bilateral: Secondary | ICD-10-CM | POA: Diagnosis not present

## 2020-08-19 DIAGNOSIS — T1501XA Foreign body in cornea, right eye, initial encounter: Secondary | ICD-10-CM | POA: Diagnosis not present

## 2020-08-21 DIAGNOSIS — H04123 Dry eye syndrome of bilateral lacrimal glands: Secondary | ICD-10-CM | POA: Diagnosis not present

## 2020-09-05 ENCOUNTER — Other Ambulatory Visit: Payer: Self-pay | Admitting: Family Medicine

## 2020-09-05 DIAGNOSIS — J61 Pneumoconiosis due to asbestos and other mineral fibers: Secondary | ICD-10-CM

## 2020-09-05 DIAGNOSIS — E041 Nontoxic single thyroid nodule: Secondary | ICD-10-CM

## 2020-09-10 NOTE — Progress Notes (Signed)
OFFICE VISIT  09/11/2020  CC:  Chief Complaint  Patient presents with  . Follow-up    Has question about Atovastatin    HPI:    Patient is a 70 y.o. Caucasian male who presents for 6 mo f/u HLD, IFG, and elev bp w/out dx HTN. A/P as of last visit: "1) HLD: tolerating statin, diet and exercise level good. Cont atorva 40mg  qd. FLP and hepatic panel today.  2) Elev bp w/out dx htn: continue periodic home bp check.  3) IFG: diet healthy, exercises regularly. Fasting gluc and Hba1c today.  4) Health maintenance exam: Reviewed age and gender appropriate health maintenance issues (prudent diet, regular exercise, health risks of tobacco and excessive alcohol, use of seatbelts, fire alarms in home, use of sunscreen).  Also reviewed age and gender appropriate health screening as well as vaccine recommendations. Vaccines: Flu->given today.  Shingrix->discussed, will send rx to pharmacy.  Otherwise all UTD. Labs: fasting HP, a1c. Prostate ca screening: hx of elev PSA->Urol followed up until pandemic hit--his last visit with Dr. Alinda Money was 2019--check PSA today. Colon ca screening: hx of polyps, next colonoscopy due 05/2023"  INTERIM HX: Feeling well.  Remains very active, play pickle ball regularly.  PSA very mildly elevated last check 6 mo ago, pt has hx of this and has been followed by urol in the past for this, we plan rpt PSA today.  HLD:  Tolerating atorva 40mg  qd.  Elev bp w/out dx HTN:  occ home bp check <130/80.  Prediab:  Has improved diet, has always been active and remains so.  ROS as above, plus--> no fevers, no CP, no SOB, no wheezing, no cough, no dizziness, no HAs, no rashes, no melena/hematochezia.  No polyuria or polydipsia.  No myalgias or arthralgias.  No focal weakness, paresthesias, or tremors.  No acute vision or hearing abnormalities.  No dysuria or unusual/new urinary urgency or frequency.  No recent changes in lower legs. No n/v/d or abd pain.  No  palpitations.     Past Medical History:  Diagnosis Date  . Arthritis    both shoulders- aleve helps  . 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 Jorene Guest.->CT good 03/2019.  Marland Kitchen Basal cell carcinoma nose   removed approx year 2000  . Carpal tunnel syndrome on both sides    wrist splints no help  . Elevated blood pressure reading without diagnosis of hypertension    blood pressure has been slightly elevated at times - but lost weight - and has never required b/p meds  . Elevated PSA    [facility]  . History of adenomatous polyp of colon 05/24/2018   Multiple colonoscopies.  Most recent 05/24/18-adenoma x 1, recall 5 yrs ([facility])  . History of kidney stones    Analysis 2019->Ca++ oxalate.  Marland Kitchen History of thyroid nodule 2014   nodule found on chest CT 2014.  Resected by Dr. Remer Macho lobectomy->benign  . Hypercholesterolemia   . IFG (impaired fasting glucose)    115 03/2018.  A1c 5.6% and fasting gluc 112 Feb 2021. A1c 5.6% Nov 2021.    Past Surgical History:  Procedure Laterality Date  . ABI's  02/2019   Bilat: NORMAL   . COLONOSCOPY    . CYSTOSCOPY WITH STENT PLACEMENT Left 01/31/2018   Procedure: CYSTOSCOPY WITH RIGHT URETERAL STENT PLACEMENT;  Surgeon: Raynelle Bring, MD;  [facility];  Service:  Urology;  Laterality: Left;  . CYSTOSCOPY/URETEROSCOPY/HOLMIUM LASER/STENT PLACEMENT Left 02/08/2018   For extraction of L ureteral stone, L ureteroscopic laser lithotripsy, + ureteral stent placement. Procedure: CYSTOSCOPY/URETEROSCOPY/HOLMIUM LASER/STENT PLACEMENT;  Surgeon: Raynelle Bring, MD;  [facility];  Service: Urology;  Laterality: Left;  1 HR  . LITHOTRIPSY     pt states that this was done a long time ago  . THYROID LOBECTOMY Left 08/12/2013   Procedure: THYROID LOBECTOMY;  Surgeon: Earnstine Regal, MD;  [facility];  Service: General;  Laterality: Left;     Outpatient Medications Prior to Visit  Medication Sig Dispense Refill  . augmented betamethasone dipropionate (DIPROLENE-AF) 0.05 % cream Apply topically 2 (two) times daily as needed.    . Coenzyme Q10 (COQ10) 100 MG CAPS Take by mouth daily.     Marland Kitchen triamcinolone (NASACORT) 55 MCG/ACT AERO nasal inhaler Place 2 sprays into the nose daily.    Marland Kitchen atorvastatin (LIPITOR) 40 MG tablet TAKE 1 TABLET DAILY 30 tablet 0   No facility-administered medications prior to visit.    No Known Allergies  ROS As per HPI  PE: Vitals with BMI 09/11/2020 03/14/2020 05/26/2019  Height 5\' 5"  5\' 5"  5\' 5"   Weight 168 lbs 3 oz 171 lbs 10 oz 174 lbs  BMI 27.99 91.63 84.66  Systolic 599 357 017  Diastolic 41 80 81  Pulse 72 75 82     Gen: Alert, well appearing.  Patient is oriented to person, place, time, and situation. AFFECT: pleasant, lucid thought and speech. CV: RRR, no m/r/g.   LUNGS: CTA bilat, nonlabored resps, good aeration in all lung fields. EXT: no clubbing or cyanosis.  no edema.    LABS:  Lab Results  Component Value Date   TSH 2.59 03/14/2020   Lab Results  Component Value Date   WBC 6.4 03/14/2020   HGB 16.3 03/14/2020   HCT 49.2 03/14/2020   MCV 89.1 03/14/2020   PLT 256.0 03/14/2020   Lab Results  Component Value Date   CREATININE 1.07 03/14/2020   BUN 22 03/14/2020   NA 140 03/14/2020   K 4.6 03/14/2020   CL 104 03/14/2020   CO2 28 03/14/2020   Lab Results  Component Value Date   ALT 21 03/14/2020   AST 21 03/14/2020   ALKPHOS 43 03/14/2020   BILITOT 0.7 03/14/2020   Lab Results  Component Value Date   CHOL 151 03/14/2020   Lab Results  Component Value Date   HDL 56.40 03/14/2020   Lab Results  Component Value Date   LDLCALC 86 03/14/2020   Lab Results  Component Value Date   TRIG 43.0 03/14/2020   Lab Results  Component Value Date   CHOLHDL 3 03/14/2020   Lab Results  Component Value Date   PSA 4.23 (H) 03/14/2020   PSA 3.96 05/23/2019    PSA 4.1 03/29/2018   Lab Results  Component Value Date   HGBA1C 5.6 03/14/2020   IMPRESSION AND PLAN:  1) Elev bp w/out dx HTN: stable/normal home bp's.  2) Prediabetes: improving diet, very active.  Last 2 Hba1c's 5.6%, most recent 6 mo ago. Fasting gluc and Hba1c next f/u in 14mo.  3) HLD: LDL goal around 100, was 86 six mo ago.  Tolerating atorva 40 qd, RF'd today. FLP and hepatic panel today.  4) Elev PSA->PSA very mildly elevated last check 6 mo ago, pt has hx of this and has been followed by urol in the past for this, rpt  PSA today. If rising then will have him get back with his urologist.  He mentioned today that he'll be arranging f/u with urol regarding his hx of kidney stones.  An After Visit Summary was printed and given to the patient.  FOLLOW UP: Return in about 6 months (around 03/14/2021) for annual CPE (fasting).  Signed:  Crissie Sickles, MD           09/11/2020

## 2020-09-11 ENCOUNTER — Ambulatory Visit (INDEPENDENT_AMBULATORY_CARE_PROVIDER_SITE_OTHER): Payer: Medicare Other | Admitting: Family Medicine

## 2020-09-11 ENCOUNTER — Encounter: Payer: Self-pay | Admitting: Family Medicine

## 2020-09-11 ENCOUNTER — Other Ambulatory Visit: Payer: Self-pay

## 2020-09-11 VITALS — BP 143/41 | HR 72 | Temp 97.6°F | Ht 65.0 in | Wt 168.2 lb

## 2020-09-11 DIAGNOSIS — R972 Elevated prostate specific antigen [PSA]: Secondary | ICD-10-CM | POA: Diagnosis not present

## 2020-09-11 DIAGNOSIS — Z77090 Contact with and (suspected) exposure to asbestos: Secondary | ICD-10-CM

## 2020-09-11 DIAGNOSIS — E7800 Pure hypercholesterolemia, unspecified: Secondary | ICD-10-CM | POA: Diagnosis not present

## 2020-09-11 DIAGNOSIS — Z125 Encounter for screening for malignant neoplasm of prostate: Secondary | ICD-10-CM

## 2020-09-11 DIAGNOSIS — R7303 Prediabetes: Secondary | ICD-10-CM

## 2020-09-11 LAB — HEPATIC FUNCTION PANEL
ALT: 19 U/L (ref 0–53)
AST: 21 U/L (ref 0–37)
Albumin: 4.6 g/dL (ref 3.5–5.2)
Alkaline Phosphatase: 45 U/L (ref 39–117)
Bilirubin, Direct: 0.2 mg/dL (ref 0.0–0.3)
Total Bilirubin: 1 mg/dL (ref 0.2–1.2)
Total Protein: 7 g/dL (ref 6.0–8.3)

## 2020-09-11 LAB — LIPID PANEL
Cholesterol: 150 mg/dL (ref 0–200)
HDL: 61.2 mg/dL (ref >39.00)
LDL Cholesterol: 81 mg/dL (ref 0–99)
NonHDL: 88.38
Total CHOL/HDL Ratio: 2
Triglycerides: 39 mg/dL (ref 0.0–149.0)
VLDL: 7.8 mg/dL (ref 0.0–40.0)

## 2020-09-11 LAB — PSA, MEDICARE: PSA: 4.35 ng/ml — ABNORMAL HIGH (ref 0.10–4.00)

## 2020-09-11 MED ORDER — ATORVASTATIN CALCIUM 40 MG PO TABS
1.0000 | ORAL_TABLET | Freq: Every day | ORAL | 3 refills | Status: DC
Start: 2020-09-11 — End: 2021-09-18

## 2020-10-31 ENCOUNTER — Ambulatory Visit (INDEPENDENT_AMBULATORY_CARE_PROVIDER_SITE_OTHER): Payer: Medicare Other | Admitting: Family Medicine

## 2020-10-31 ENCOUNTER — Encounter: Payer: Self-pay | Admitting: Family Medicine

## 2020-10-31 ENCOUNTER — Other Ambulatory Visit: Payer: Self-pay

## 2020-10-31 VITALS — BP 135/76 | HR 64 | Temp 98.0°F | Resp 16 | Ht 65.0 in | Wt 169.8 lb

## 2020-10-31 DIAGNOSIS — H6122 Impacted cerumen, left ear: Secondary | ICD-10-CM

## 2020-10-31 NOTE — Progress Notes (Signed)
OFFICE VISIT  10/31/2020  CC:  Chief Complaint  Patient presents with   Ear Pain    Left; unable to hear. Tried using drops to loosen wax but unable to get wax out, last used 3 days ago.   HPI:    Patient is a 70 y.o. Caucasian male who presents for ears feeling clogged x 1 wk, L>>R. Tried drops/home irrigation/suction for L ear but no help. No ear pain. Has hx of cerumen impaction requiring office irrigation in the past.    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 Jorene Guest.->CT good 03/2019.   Basal cell carcinoma nose   removed approx year 2000   Carpal tunnel syndrome on both sides    wrist splints no help   Elevated blood pressure reading without diagnosis of hypertension    blood pressure has been slightly elevated at times - but lost weight - and has never required b/p meds   Elevated PSA    [facility]   History of adenomatous polyp of colon 05/24/2018   Multiple colonoscopies.  Most recent 05/24/18-adenoma x 1, recall 5 yrs ([facility])   History of kidney stones    Analysis 2019->Ca++ oxalate.   History of thyroid nodule 2014   nodule found on chest CT 2014.  Resected by Dr. Remer Macho lobectomy->benign   Hypercholesterolemia    IFG (impaired fasting glucose)    115 03/2018.  A1c 5.6% and fasting gluc 112 Feb 2021. A1c 5.6% Nov 2021.    Past Surgical History:  Procedure Laterality Date   ABI's  02/2019   Bilat: NORMAL    COLONOSCOPY     CYSTOSCOPY WITH STENT PLACEMENT Left 01/31/2018   Procedure: CYSTOSCOPY WITH RIGHT URETERAL STENT PLACEMENT;  Surgeon: Raynelle Bring, MD;  Location: [facility];  Service: Urology;  Laterality: Left;   CYSTOSCOPY/URETEROSCOPY/HOLMIUM LASER/STENT PLACEMENT Left 02/08/2018   For extraction of L ureteral stone, L ureteroscopic laser lithotripsy, +  ureteral stent placement. Procedure: CYSTOSCOPY/URETEROSCOPY/HOLMIUM LASER/STENT PLACEMENT;  Surgeon: Raynelle Bring, MD;  Location: [facility];  Service: Urology;  Laterality: Left;  1 HR   LITHOTRIPSY     pt states that this was done a long time ago   THYROID LOBECTOMY Left 08/12/2013   Procedure: THYROID LOBECTOMY;  Surgeon: Earnstine Regal, MD;  Location: [facility];  Service: General;  Laterality: Left;    Outpatient Medications Prior to Visit  Medication Sig Dispense Refill   atorvastatin (LIPITOR) 40 MG tablet Take 1 tablet (40 mg total) by mouth daily. 90 tablet 3   augmented betamethasone dipropionate (DIPROLENE-AF) 0.05 % cream Apply topically 2 (two) times daily as needed.     Coenzyme Q10 (COQ10) 100 MG CAPS Take by mouth daily.      triamcinolone (NASACORT) 55 MCG/ACT AERO nasal inhaler Place 2 sprays into the nose daily.     No facility-administered medications prior to visit.    No Known Allergies  ROS As per HPI  PE: Vitals with BMI 10/31/2020 09/11/2020 03/14/2020  Height 5\' 5"  5\' 5"  5\' 5"   Weight 169 lbs 13 oz 168 lbs 3 oz 171 lbs 10 oz  BMI 28.26 00.93 81.82  Systolic 993 716 967  Diastolic 76 41 80  Pulse 64 72 75     Gen: Alert, well appearing.  Patient  is oriented to person, place, time, and situation. AFFECT: pleasant, lucid thought and speech. R EAC with moderate amount of cerumen but I can see TM and it appears normal.  Canal w/out swelling or erythema. L EAC completely occluded by cerumen.  LABS:    Chemistry      Component Value Date/Time   NA 140 03/14/2020 0923   NA 141 03/29/2018 0000   K 4.6 03/14/2020 0923   CL 104 03/14/2020 0923   CO2 28 03/14/2020 0923   BUN 22 03/14/2020 0923   BUN 13 03/29/2018 0000   CREATININE 1.07 03/14/2020 0923   GLU 112 03/29/2018 0000      Component Value Date/Time   CALCIUM 9.4 03/14/2020 0923   ALKPHOS 45 09/11/2020 0853   AST 21 09/11/2020 0853   ALT 19 09/11/2020 0853   BILITOT 1.0 09/11/2020 0853      IMPRESSION AND PLAN:  Cerumen impaction, partial on R, complete on L. Consent obtained and ear irrigation performed in left ear.  This resulted in complete clearance of cerumen and resolution of pt's symptoms.  L TM normal.  No treatment needed on R EAC.  Discussed regular use of otc wax-dissolving drops.  An After Visit Summary was printed and given to the patient.  FOLLOW UP: Return if symptoms worsen or fail to improve.  Signed:  Crissie Sickles, MD           10/31/2020

## 2020-11-14 ENCOUNTER — Ambulatory Visit (INDEPENDENT_AMBULATORY_CARE_PROVIDER_SITE_OTHER): Payer: Medicare Other | Admitting: *Deleted

## 2020-11-14 DIAGNOSIS — Z0000 Encounter for general adult medical examination without abnormal findings: Secondary | ICD-10-CM

## 2020-11-14 NOTE — Patient Instructions (Signed)
Jason Hansen , Thank you for taking time to come for your Medicare Wellness Visit. I appreciate your ongoing commitment to your health goals. Please review the following plan we discussed and let me know if I can assist you in the future.   Screening recommendations/referrals: Colonoscopy: up to date Recommended yearly ophthalmology/optometry visit for glaucoma screening and checkup Recommended yearly dental visit for hygiene and checkup  Vaccinations: Influenza vaccine: up to date Pneumococcal vaccine: Education provided Tdap vaccine: up to date Shingles vaccine: Education provided    Advanced directives: copy requested  Conditions/risks identified: na    Preventive Care 36 Years and Older, Male Preventive care refers to lifestyle choices and visits with your health care provider that can promote health and wellness. What does preventive care include? A yearly physical exam. This is also called an annual well check. Dental exams once or twice a year. Routine eye exams. Ask your health care provider how often you should have your eyes checked. Personal lifestyle choices, including: Daily care of your teeth and gums. Regular physical activity. Eating a healthy diet. Avoiding tobacco and drug use. Limiting alcohol use. Practicing safe sex. Taking low doses of aspirin every day. Taking vitamin and mineral supplements as recommended by your health care provider. What happens during an annual well check? The services and screenings done by your health care provider during your annual well check will depend on your age, overall health, lifestyle risk factors, and family history of disease. Counseling  Your health care provider may ask you questions about your: Alcohol use. Tobacco use. Drug use. Emotional well-being. Home and relationship well-being. Sexual activity. Eating habits. History of falls. Memory and ability to understand (cognition). Work and work  Statistician. Screening  You may have the following tests or measurements: Height, weight, and BMI. Blood pressure. Lipid and cholesterol levels. These may be checked every 5 years, or more frequently if you are over 36 years old. Skin check. Lung cancer screening. You may have this screening every year starting at age 8 if you have a 30-pack-year history of smoking and currently smoke or have quit within the past 15 years. Fecal occult blood test (FOBT) of the stool. You may have this test every year starting at age 77. Flexible sigmoidoscopy or colonoscopy. You may have a sigmoidoscopy every 5 years or a colonoscopy every 10 years starting at age 33. Prostate cancer screening. Recommendations will vary depending on your family history and other risks. Hepatitis C blood test. Hepatitis B blood test. Sexually transmitted disease (STD) testing. Diabetes screening. This is done by checking your blood sugar (glucose) after you have not eaten for a while (fasting). You may have this done every 1-3 years. Abdominal aortic aneurysm (AAA) screening. You may need this if you are a current or former smoker. Osteoporosis. You may be screened starting at age 56 if you are at high risk. Talk with your health care provider about your test results, treatment options, and if necessary, the need for more tests. Vaccines  Your health care provider may recommend certain vaccines, such as: Influenza vaccine. This is recommended every year. Tetanus, diphtheria, and acellular pertussis (Tdap, Td) vaccine. You may need a Td booster every 10 years. Zoster vaccine. You may need this after age 50. Pneumococcal 13-valent conjugate (PCV13) vaccine. One dose is recommended after age 47. Pneumococcal polysaccharide (PPSV23) vaccine. One dose is recommended after age 8. Talk to your health care provider about which screenings and vaccines you need and how often you  need them. This information is not intended to replace  advice given to you by your health care provider. Make sure you discuss any questions you have with your health care provider. Document Released: 05/04/2015 Document Revised: 12/26/2015 Document Reviewed: 02/06/2015 Elsevier Interactive Patient Education  2017 [place] [employer] Prevention in the Home Falls can cause injuries. They can happen to people of all ages. There are many things you can do to make your home safe and to help prevent falls. What can I do on the outside of my home? Regularly fix the edges of walkways and driveways and fix any cracks. Remove anything that might make you trip as you walk through a door, such as a raised step or threshold. Trim any bushes or trees on the path to your home. Use bright outdoor lighting. Clear any walking paths of anything that might make someone trip, such as rocks or tools. Regularly check to see if handrails are loose or broken. Make sure that both sides of any steps have handrails. Any raised decks and porches should have guardrails on the edges. Have any leaves, snow, or ice cleared regularly. Use sand or salt on walking paths during winter. Clean up any spills in your garage right away. This includes oil or grease spills. What can I do in the bathroom? Use night lights. Install grab bars by the toilet and in the tub and shower. Do not use towel bars as grab bars. Use non-skid mats or decals in the tub or shower. If you need to sit down in the shower, use a plastic, non-slip stool. Keep the floor dry. Clean up any water that spills on the floor as soon as it happens. Remove soap buildup in the tub or shower regularly. Attach bath mats securely with double-sided non-slip rug tape. Do not have throw rugs and other things on the floor that can make you trip. What can I do in the bedroom? Use night lights. Make sure that you have a light by your bed that is easy to reach. Do not use any sheets or blankets that are too big for your bed.  They should not hang down onto the floor. Have a firm chair that has side arms. You can use this for support while you get dressed. Do not have throw rugs and other things on the floor that can make you trip. What can I do in the kitchen? Clean up any spills right away. Avoid walking on wet floors. Keep items that you use a lot in easy-to-reach places. If you need to reach something above you, use a strong step stool that has a grab bar. Keep electrical cords out of the way. Do not use floor polish or wax that makes floors slippery. If you must use wax, use non-skid floor wax. Do not have throw rugs and other things on the floor that can make you trip. What can I do with my stairs? Do not leave any items on the stairs. Make sure that there are handrails on both sides of the stairs and use them. Fix handrails that are broken or loose. Make sure that handrails are as long as the stairways. Check any carpeting to make sure that it is firmly attached to the stairs. Fix any carpet that is loose or worn. Avoid having throw rugs at the top or bottom of the stairs. If you do have throw rugs, attach them to the floor with carpet tape. Make sure that you have a light switch  at the top of the stairs and the bottom of the stairs. If you do not have them, ask someone to add them for you. What else can I do to help prevent falls? Wear shoes that: Do not have high heels. Have rubber bottoms. Are comfortable and fit you well. Are closed at the toe. Do not wear sandals. If you use a stepladder: Make sure that it is fully opened. Do not climb a closed stepladder. Make sure that both sides of the stepladder are locked into place. Ask someone to hold it for you, if possible. Clearly mark and make sure that you can see: Any grab bars or handrails. First and last steps. Where the edge of each step is. Use tools that help you move around (mobility aids) if they are needed. These  include: Canes. Walkers. Scooters. Crutches. Turn on the lights when you go into a dark area. Replace any light bulbs as soon as they burn out. Set up your furniture so you have a clear path. Avoid moving your furniture around. If any of your floors are uneven, fix them. If there are any pets around you, be aware of where they are. Review your medicines with your doctor. Some medicines can make you feel dizzy. This can increase your chance of falling. Ask your doctor what other things that you can do to help prevent falls. This information is not intended to replace advice given to you by your health care provider. Make sure you discuss any questions you have with your health care provider. Document Released: 02/01/2009 Document Revised: 09/13/2015 Document Reviewed: 05/12/2014 Elsevier Interactive Patient Education  2017 [employer].

## 2020-11-14 NOTE — Progress Notes (Signed)
Subjective:   Jason Hansen. is a 70 y.o. male who presents for Medicare Annual/Subsequent preventive   I connected with  Jason Hansen. on 11/14/20 by a telephone enabled telemedicine application and verified that I am speaking with the correct person using two identifiers.   I discussed the limitations of evaluation and management by telemedicine. The patient expressed understanding and agreed to proceed. examination.  Review of Systems    na Cardiac Risk Factors include: dyslipidemia;advanced age (>68mn, >>6women);male gender     Objective:    Today's Vitals   There is no height or weight on file to calculate BMI.  Advanced Directives 11/14/2020 02/08/2018 02/04/2018 08/12/2013 08/05/2013  Does Patient Have a Medical Advance Directive? Yes Yes Yes Patient has advance directive, copy not in chart Patient has advance directive, copy not in chart  Type of Advance [place]ive [place] will [place] will  Does patient want to make changes to medical advance directive? - No - Patient declined No - Patient declined No change requested -  Copy of [place] Chart? No - copy requested No - copy requested No - copy requested - (No Data)  Pre-existing out of facility DNR order (yellow form or pink MOST form) - - - No -    Current Medications (verified) Outpatient Encounter Medications as of 11/14/2020  Medication Sig   atorvastatin (LIPITOR) 40 MG tablet Take 1 tablet (40 mg total) by mouth daily.   augmented betamethasone dipropionate (DIPROLENE-AF) 0.05 % cream Apply topically 2 (two) times daily as needed.   Coenzyme Q10 (COQ10) 100 MG CAPS Take by mouth daily.    triamcinolone (NASACORT) 55 MCG/ACT AERO nasal inhaler Place 2 sprays into the nose daily.   No  facility-administered encounter medications on file as of 11/14/2020.    Allergies (verified) Patient has no known allergies.   History: Past Medical History:  Diagnosis Date   Arthritis    both shoulders- aleve helps   [place]    exposed at work for [employer] no symptoms at this time (11/2018): gets annual f/u imaging and PFTs with pulm Dr. PJorene Guest->CT good 03/2019.   Basal cell carcinoma nose   removed approx year 2000   Carpal tunnel syndrome on both sides    wrist splints no help   Elevated blood pressure reading without diagnosis of hypertension    blood pressure has been slightly elevated at times - but lost weight - and has never required b/p meds   Elevated PSA    [facility]   History of adenomatous polyp of colon 05/24/2018   Multiple colonoscopies.  Most recent 05/24/18-adenoma x 1, recall 5 yrs ([facility])   History of kidney stones    Analysis 2019->Ca++ oxalate.   History of thyroid nodule 2014   nodule found on chest CT 2014.  Resected by Dr. GRemer Macholobectomy->benign   Hypercholesterolemia    IFG (impaired fasting glucose)    115 03/2018.  A1c 5.6% and fasting gluc 112 Feb 2021. A1c 5.6% Nov 2021.   Past Surgical History:  Procedure Laterality Date   ABI's  02/2019   Bilat: NORMAL    COLONOSCOPY     CYSTOSCOPY WITH STENT PLACEMENT Left 01/31/2018   Procedure: CYSTOSCOPY WITH RIGHT URETERAL STENT PLACEMENT;  Surgeon: BRaynelle Bring MD;  Location: [facility];  Service: Urology;  Laterality:  Left;   CYSTOSCOPY/URETEROSCOPY/HOLMIUM LASER/STENT PLACEMENT Left 02/08/2018   For extraction of L ureteral stone, L ureteroscopic laser lithotripsy, + ureteral stent placement. Procedure: CYSTOSCOPY/URETEROSCOPY/HOLMIUM LASER/STENT PLACEMENT;  Surgeon: Raynelle Bring, MD;  Location: [facility];  Service: Urology;  Laterality: Left;  1 HR   LITHOTRIPSY     pt states that this was done a long time ago   THYROID LOBECTOMY Left 08/12/2013   Procedure: THYROID  LOBECTOMY;  Surgeon: Earnstine Regal, MD;  Location: [facility];  Service: General;  Laterality: Left;   Family History  Problem Relation Age of Onset   Breast cancer Mother    Cancer Mother    Colon cancer Father    Breast cancer Sister    Social History   Socioeconomic History   Marital status: Married    Spouse name: Not on file   Number of children: Not on file   Years of education: Not on file   Highest education level: Not on file  Occupational History   Not on file  Tobacco Use   Smoking status: Former    Packs/day: 1.00    Years: 10.00    Pack years: 10.00    Types: Cigarettes   Smokeless tobacco: Former    Types: Nurse, children's Use: Never used  Substance and Sexual Activity   Alcohol use: Yes    Comment: quit smoking 34 yrs ago  alcohol occas; 2 beers last night 02/07/2018   Drug use: No   Sexual activity: Not on file  Other Topics Concern   Not on file  Social History Narrative   Married, 2 sons grown.  4 GC.   Orig from [place].   Occup: retired from [employer] at the [employer] on [address].   No Tob.   Occ alcohol.   Social Determinants of Health   Financial Resource Strain: Low Risk    Difficulty of Paying Living Expenses: Not hard at all  Food Insecurity: No Food Insecurity   Worried About Charity fundraiser in the Last Year: Never true   [place] in the Last Year: Never true  Transportation Needs: No Transportation Needs   Lack of Transportation (Medical): No   Lack of Transportation (Non-Medical): No  Physical Activity: Sufficiently Active   Days of Exercise per Week: 5 days   Minutes of Exercise per Session: 50 min  Stress: No Stress Concern Present   Feeling of Stress : Not at all  Social Connections: Socially Integrated   Frequency of Communication with Friends and Family: More than three times a week   Frequency of Social Gatherings with Friends and Family: More than three times a week   Attends Religious Services:  More than 4 times per year   Active Member of [employer] or Organizations: Yes   Attends Music therapist: More than 4 times per year   Marital Status: Married    Tobacco Counseling Counseling given: Not Answered   Clinical Intake:  Pre-visit preparation completed: Yes  Pain : No/denies pain     Nutritional Risks: None Diabetes: No  How often do you need to have someone help you when you read instructions, pamphlets, or other written materials from your doctor or pharmacy?: 1 - Never  Diabetic?  no  Interpreter Needed?: No  Information entered by :: Leroy Kennedy LPN   Activities of Daily Living In your present state of health, do you have any difficulty performing the following activities:  11/14/2020 09/11/2020  Hearing? N N  Vision? N N  Difficulty concentrating or making decisions? N N  Walking or climbing stairs? N N  Dressing or bathing? N N  Doing errands, shopping? N N  Preparing Food and eating ? N -  Using the Toilet? N -  In the past six months, have you accidently leaked urine? N -  Do you have problems with loss of bowel control? N -  Managing your Medications? N -  Managing your Finances? N -  Housekeeping or managing your Housekeeping? N -  Some recent data might be hidden    Patient Care Team: Tammi Sou, MD as PCP - General (Family Medicine) Armandina Gemma, MD as Consulting Physician (General Surgery) Raynelle Bring, MD as Consulting Physician (Urology) Pearlie Oyster Estill Bamberg, MD as Consulting Physician (Pulmonary Disease)  Indicate any recent Medical Services you may have received from other than [facility] providers in the past year (date may be approximate).     Assessment:   This is a routine wellness examination for [facility].  Hearing/Vision screen No results found.  Dietary issues and exercise activities discussed: Current Exercise Habits: Home exercise routine (pickle ball), Type of exercise: walking, Time (Minutes): 60, Frequency  (Times/Week): 5, Weekly Exercise (Minutes/Week): 300, Intensity: Moderate   Goals Addressed             This Visit's Progress    Patient Stated       Maintain current lifestyle       Depression Screen PHQ 2/9 Scores 11/14/2020 09/11/2020 05/26/2019  PHQ - 2 Score 0 0 0    Fall Risk Fall Risk  11/14/2020 09/11/2020  Falls in the past year? 0 0  Number falls in past yr: 0 0  Injury with Fall? 0 0  Follow up Falls evaluation completed;Falls prevention discussed -    FALL RISK PREVENTION PERTAINING TO THE HOME:  Any stairs in or around the home? Yes  If so, are there any without handrails? Yes  Home free of loose throw rugs in walkways, pet beds, electrical cords, etc? Yes  Adequate lighting in your home to reduce risk of falls? Yes   ASSISTIVE DEVICES UTILIZED TO PREVENT FALLS:  Life alert? No  Use of a cane, walker or w/c? No  Grab bars in the bathroom? Yes  Shower chair or bench in shower? Yes  Elevated toilet seat or a handicapped toilet? No   TIMED UP AND GO:  Was the test performed? No .    Cognitive Function:  Normal cognitive status assessed by direct observation by this Nurse Health Advisor. No abnormalities found.          Immunizations Immunization History  Administered Date(s) Administered   Fluad Quad(high Dose 65+) 03/02/2019, 03/14/2020   Influenza, High Dose Seasonal PF 02/12/2017   Moderna Sars-Covid-2 Vaccination 06/20/2019, 08/20/2019   Pneumococcal Conjugate-13 02/12/2017   Pneumococcal Polysaccharide-23 03/02/2019   Td 04/22/2007   Tdap 11/22/2014    TDAP status: Up to date  Flu Vaccine status: Up to date  Pneumococcal vaccine status: Due, Education has been provided regarding the importance of this vaccine. Advised may receive this vaccine at local pharmacy or Health Dept. Aware to provide a copy of the vaccination record if obtained from local pharmacy or Health Dept. Verbalized acceptance and understanding.  Covid-19 vaccine  status: Information provided on how to obtain vaccines.   Qualifies for Shingles Vaccine? Yes   Zostavax completed No   Shingrix Completed?: No.  Education has been provided regarding the importance of this vaccine. Patient has been advised to call insurance company to determine out of pocket expense if they have not yet received this vaccine. Advised may also receive vaccine at local pharmacy or Health Dept. Verbalized acceptance and understanding.  Screening Tests Health Maintenance  Topic Date Due   Zoster Vaccines- Shingrix (1 of 2) Never done   COVID-19 Vaccine (3 - Booster for Moderna series) 01/20/2020   INFLUENZA VACCINE  11/19/2020   TETANUS/TDAP  11/21/2024   COLONOSCOPY (Pts 45-59yr Insurance coverage will need to be confirmed)  05/24/2028   PNA vac Low Risk Adult  Completed   HPV VACCINES  Aged Out   Hepatitis C Screening  Discontinued    Health Maintenance  Health Maintenance Due  Topic Date Due   Zoster Vaccines- Shingrix (1 of 2) Never done   COVID-19 Vaccine (3 - Booster for Moderna series) 01/20/2020    Colorectal cancer screening: Type of screening: Colonoscopy. Completed  . Repeat every 10 years  Lung Cancer Screening: (Low Dose CT Chest recommended if Age 69-80years, 30 pack-year currently smoking OR have quit w/in 15years.) does not qualify.   Lung Cancer Screening Referral: na  Additional Screening:  Hepatitis C Screening: does not qualify; Completed   Vision Screening: Recommended annual ophthalmology exams for early detection of glaucoma and other disorders of the eye. Is the patient up to date with their annual eye exam?  Yes  Who is the provider or what is the name of the office in which the patient attends annual eye exams? Dr YRushie NyhanNC If pt is not established with a provider, would they like to be referred to a provider to establish care? No .   Dental Screening: Recommended annual dental exams for proper oral hygiene  Community  Resource Referral / Chronic Care Management: CRR required this visit?  No   CCM required this visit?  No      Plan:     I have personally reviewed and noted the following in the patient's chart:   Medical and social history Use of alcohol, tobacco or illicit drugs  Current medications and supplements including opioid prescriptions. Patient is not currently taking opioid prescriptions. Functional ability and status Nutritional status Physical activity Advanced directives List of other physicians Hospitalizations, surgeries, and ER visits in previous 12 months Vitals Screenings to include cognitive, depression, and falls Referrals and appointments  In addition, I have reviewed and discussed with patient certain preventive protocols, quality metrics, and best practice recommendations. A written personalized care plan for preventive services as well as general preventive health recommendations were provided to patient.     JLeroy Kennedy LPN   [phone]  Nurse Notes: na

## 2020-12-12 LAB — PSA: PSA: 4.92

## 2020-12-13 ENCOUNTER — Other Ambulatory Visit: Payer: Self-pay | Admitting: Urology

## 2020-12-13 DIAGNOSIS — R972 Elevated prostate specific antigen [PSA]: Secondary | ICD-10-CM

## 2020-12-14 ENCOUNTER — Encounter: Payer: Self-pay | Admitting: Family Medicine

## 2021-01-06 ENCOUNTER — Other Ambulatory Visit: Payer: Medicare Other

## 2021-01-09 ENCOUNTER — Ambulatory Visit
Admission: RE | Admit: 2021-01-09 | Discharge: 2021-01-09 | Disposition: A | Discharge status: Home / Self Care | Payer: Medicare Other | Source: Ambulatory Visit | Attending: Urology | Admitting: Urology

## 2021-01-09 ENCOUNTER — Other Ambulatory Visit: Payer: Self-pay

## 2021-01-09 ENCOUNTER — Other Ambulatory Visit: Payer: Self-pay | Admitting: Urology

## 2021-01-09 DIAGNOSIS — R972 Elevated prostate specific antigen [PSA]: Secondary | ICD-10-CM

## 2021-01-09 DIAGNOSIS — Z135 Encounter for screening for eye and ear disorders: Secondary | ICD-10-CM | POA: Diagnosis not present

## 2021-01-09 MED ORDER — GADOBENATE DIMEGLUMINE 529 MG/ML IV SOLN
15.0000 mL | Freq: Once | INTRAVENOUS | Status: AC | PRN
  Administered 2021-01-09: 15 mL via INTRAVENOUS

## 2021-01-19 DIAGNOSIS — C61 Malignant neoplasm of prostate: Secondary | ICD-10-CM

## 2021-01-19 HISTORY — DX: Malignant neoplasm of prostate: C61

## 2021-03-28 DIAGNOSIS — Z1283 Encounter for screening for malignant neoplasm of skin: Secondary | ICD-10-CM | POA: Diagnosis not present

## 2021-03-28 DIAGNOSIS — L570 Actinic keratosis: Secondary | ICD-10-CM | POA: Diagnosis not present

## 2021-03-28 DIAGNOSIS — X32XXXD Exposure to sunlight, subsequent encounter: Secondary | ICD-10-CM | POA: Diagnosis not present

## 2021-03-28 DIAGNOSIS — D225 Melanocytic nevi of trunk: Secondary | ICD-10-CM | POA: Diagnosis not present

## 2021-04-01 NOTE — Progress Notes (Signed)
GU Location of Tumor / Histology: Prostate Ca  If Prostate Cancer, Gleason Score is (4 + 3) and PSA is (4.92 as of 01/09/21)  Biopsies  Dr. Alinda Money         Past/Anticipated interventions by urology, if any:   Past/Anticipated interventions by medical oncology, if any:   Weight changes, if any: No  IPSS:  12 SHIM:  19  Bowel/Bladder complaints, if any:  No bowel (hemorrhoids) and No bladder issues (hx of Kidney stones)  Nausea/Vomiting, if any: No  Pain issues, if any:  0/10  SAFETY ISSUES: Prior radiation?  No Pacemaker/ICD?  No Possible current pregnancy?  Male Is the patient on methotrexate?  No  Current Complaints / other details:  No complaints.

## 2021-04-02 ENCOUNTER — Other Ambulatory Visit: Payer: Self-pay

## 2021-04-02 ENCOUNTER — Ambulatory Visit
Admission: RE | Admit: 2021-04-02 | Discharge: 2021-04-02 | Disposition: A | Discharge status: Home / Self Care | Payer: Medicare Other | Source: Ambulatory Visit | Attending: Radiation Oncology | Admitting: Radiation Oncology

## 2021-04-02 VITALS — BP 154/78 | HR 78 | Temp 97.6°F | Resp 18 | Ht 65.0 in | Wt 175.6 lb

## 2021-04-02 DIAGNOSIS — Z800 Family history of malignant neoplasm of digestive organs: Secondary | ICD-10-CM | POA: Diagnosis not present

## 2021-04-02 DIAGNOSIS — M129 Arthropathy, unspecified: Secondary | ICD-10-CM | POA: Insufficient documentation

## 2021-04-02 DIAGNOSIS — C61 Malignant neoplasm of prostate: Secondary | ICD-10-CM

## 2021-04-02 DIAGNOSIS — Z87891 Personal history of nicotine dependence: Secondary | ICD-10-CM | POA: Insufficient documentation

## 2021-04-02 DIAGNOSIS — Z803 Family history of malignant neoplasm of breast: Secondary | ICD-10-CM | POA: Insufficient documentation

## 2021-04-02 DIAGNOSIS — Z86010 Personal history of colonic polyps: Secondary | ICD-10-CM | POA: Insufficient documentation

## 2021-04-02 DIAGNOSIS — Z87442 Personal history of urinary calculi: Secondary | ICD-10-CM | POA: Insufficient documentation

## 2021-04-02 DIAGNOSIS — Z85828 Personal history of other malignant neoplasm of skin: Secondary | ICD-10-CM | POA: Diagnosis not present

## 2021-04-02 DIAGNOSIS — Z79899 Other long term (current) drug therapy: Secondary | ICD-10-CM | POA: Diagnosis not present

## 2021-04-02 DIAGNOSIS — E7800 Pure hypercholesterolemia, unspecified: Secondary | ICD-10-CM | POA: Diagnosis not present

## 2021-04-02 NOTE — Progress Notes (Signed)
Radiation Oncology         [phone]) [phone] ________________________________  Initial Outpatient Consultation  Name: Jason Hansen. MRN: [id number]  Date: 04/02/2021  DOB: [birth year]  CC:McGowen, Adrian Blackwater, MD  Raynelle Bring, MD   REFERRING PHYSICIAN: Raynelle Bring, MD  DIAGNOSIS: 70 y.o. gentleman with Stage T1c adenocarcinoma of the prostate with Gleason score of 3+4, and PSA of 4.92.    ICD-10-CM   1. Malignant neoplasm of prostate ([place])  C61       HISTORY OF PRESENT ILLNESS: Jason Heinz. is a 70 y.o. male with a diagnosis of prostate cancer. He was noted to have an elevated PSA of 4.35 by his primary care physician, Dr. Anitra Lauth.  Accordingly, he was referred for evaluation in urology by Dr. Alinda Money, whom he had previously seen for urolithiasis in 2019, on 12/12/20. Digital rectal examination was performed at that time revealing no nodules. Repeat PSA that day showed slightly persistent elevation at 4.92. He underwent prostate MRI on 01/09/21 showing a PI-RADS 5 lesion in the right posteroplateral peripheral zone of mid gland, with PI-RADS 3 signal in right peripheral zone base with mild prostatomegaly associated with BPH. The patient proceeded to MR fusion transrectal ultrasound with 20 biopsies of the prostate on 02/14/21.  The prostate volume measured 50.2 cc.  Out of 20 core biopsies, 6 were positive.  The maximum Gleason score was 3+4, and this was seen in the right mid lateral core. Additionally, Gleason 3+3 was seen in the right apex lateral, right base lateral, left base (small focus), and one of four samples from ROI #2.  The patient reviewed the biopsy results with his urologist and he has kindly been referred today for discussion of potential radiation treatment options.   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 Jorene Guest.->CT good 03/2019.   Basal cell carcinoma nose   removed approx year 2000   Carpal tunnel syndrome on both sides    wrist splints no help   Elevated blood pressure reading without diagnosis of hypertension    blood pressure has been slightly elevated at times - but lost weight - and has never required b/p meds   Elevated PSA    Dr. Alinda Money 12/12/20->rpt PSA plan is mri prost +bx unless PSA comes down.   History of adenomatous polyp of colon 05/24/2018   Multiple colonoscopies.  Most recent 05/24/18-adenoma x 1, recall 5 yrs ([facility])   History of kidney stones    Analysis 2019->Ca++ oxalate.   History of thyroid nodule 2014   nodule found on chest CT 2014.  Resected by Dr. Remer Macho lobectomy->benign   Hypercholesterolemia    IFG (impaired fasting glucose)    115 03/2018.  A1c 5.6% and fasting gluc 112 Feb 2021. A1c 5.6% Nov 2021.      PAST SURGICAL HISTORY: Past Surgical History:  Procedure Laterality Date   ABI's  02/2019   Bilat: NORMAL    COLONOSCOPY     CYSTOSCOPY WITH STENT PLACEMENT Left 01/31/2018   Procedure: CYSTOSCOPY WITH RIGHT URETERAL STENT PLACEMENT;  Surgeon: Raynelle Bring, MD;  Location: [facility];  Service: Urology;  Laterality: Left;   CYSTOSCOPY/URETEROSCOPY/HOLMIUM LASER/STENT PLACEMENT Left 02/08/2018   For extraction of L ureteral  stone, L ureteroscopic laser lithotripsy, + ureteral stent placement. Procedure: CYSTOSCOPY/URETEROSCOPY/HOLMIUM LASER/STENT PLACEMENT;  Surgeon: Raynelle Bring, MD;  Location: [facility];  Service: Urology;  Laterality: Left;  1 HR   LITHOTRIPSY     pt states that this was done a long time ago   THYROID LOBECTOMY Left 08/12/2013   Procedure: THYROID LOBECTOMY;  Surgeon: Earnstine Regal, MD;  Location: [facility];  Service: General;  Laterality: Left;    FAMILY HISTORY:  Family History  Problem Relation Age of Onset   Breast cancer Mother     Cancer Mother    Colon cancer Father    Breast cancer Sister     SOCIAL HISTORY:  Social History   Socioeconomic History   Marital status: Married    Spouse name: Not on file   Number of children: Not on file   Years of education: Not on file   Highest education level: Not on file  Occupational History   Not on file  Tobacco Use   Smoking status: Former    Packs/day: 1.00    Years: 10.00    Pack years: 10.00    Types: Cigarettes   Smokeless tobacco: Former    Types: Nurse, children's Use: Never used  Substance and Sexual Activity   Alcohol use: Yes    Comment: quit smoking 34 yrs ago  alcohol occas; 2 beers last night 02/07/2018   Drug use: No   Sexual activity: Not on file  Other Topics Concern   Not on file  Social History Narrative   Married, 2 sons grown.  4 GC.   Orig from [place].   Occup: retired from [employer] at the [employer] on [address].   No Tob.   Occ alcohol.   Social Determinants of Health   Financial Resource Strain: Low Risk    Difficulty of Paying Living Expenses: Not hard at all  Food Insecurity: No Food Insecurity   Worried About Charity fundraiser in the Last Year: Never true   [place] in the Last Year: Never true  Transportation Needs: No Transportation Needs   Lack of Transportation (Medical): No   Lack of Transportation (Non-Medical): No  Physical Activity: Sufficiently Active   Days of Exercise per Week: 5 days   Minutes of Exercise per Session: 50 min  Stress: No Stress Concern Present   Feeling of Stress : Not at all  Social Connections: Socially Integrated   Frequency of Communication with Friends and Family: More than three times a week   Frequency of Social Gatherings with Friends and Family: More than three times a week   Attends Religious Services: More than 4 times per year   Active Member of [employer] or Organizations: Yes   Attends Music therapist: More than 4 times per year   Marital  Status: Married  Human resources officer Violence: Not At Risk   Fear of Current or Ex-Partner: No   Emotionally Abused: No   Physically Abused: No   Sexually Abused: No    ALLERGIES: Patient has no known allergies.  MEDICATIONS:  Current Outpatient Medications  Medication Sig Dispense Refill   atorvastatin (LIPITOR) 40 MG tablet Take 1 tablet (40 mg total) by mouth daily. 90 tablet 3   augmented betamethasone dipropionate (DIPROLENE-AF) 0.05 % cream Apply topically 2 (two) times daily as needed.     Coenzyme Q10 (COQ10) 100 MG CAPS Take by mouth daily.  triamcinolone (NASACORT) 55 MCG/ACT AERO nasal inhaler Place 2 sprays into the nose daily.     No current facility-administered medications for this encounter.    REVIEW OF SYSTEMS:  On review of systems, the patient reports that he is doing well overall. He denies any chest pain, shortness of breath, cough, fevers, chills, night sweats, unintended weight changes. He denies any bowel disturbances, and denies abdominal pain, nausea or vomiting. He denies any new musculoskeletal or joint aches or pains. His IPSS was 12, indicating mild-moderate urinary symptoms. His SHIM was 19, indicating he he has mild erectile dysfunction. A complete review of systems is obtained and is otherwise negative.    PHYSICAL EXAM:  Wt Readings from Last 3 Encounters:  04/02/21 175 lb 9.6 oz (79.7 kg)  10/31/20 169 lb 12.8 oz (77 kg)  09/11/20 168 lb 3.2 oz (76.3 kg)   Temp Readings from Last 3 Encounters:  04/02/21 97.6 F (36.4 C) (Oral)  10/31/20 98 F (36.7 C) (Oral)  09/11/20 97.6 F (36.4 C) (Temporal)   BP Readings from Last 3 Encounters:  04/02/21 (!) 154/78  10/31/20 135/76  09/11/20 (!) 143/41   Pulse Readings from Last 3 Encounters:  04/02/21 78  10/31/20 64  09/11/20 72   Pain Assessment Pain Score: 0-No pain/10  In general this is a well appearing Caucasian male in no acute distress. He's alert and oriented x4 and appropriate  throughout the examination. Cardiopulmonary assessment is negative for acute distress, and he exhibits normal effort.     KPS = 100  100 - Normal; no complaints; no evidence of disease. 90   - Able to carry on normal activity; minor signs or symptoms of disease. 80   - Normal activity with effort; some signs or symptoms of disease. 18   - Cares for self; unable to carry on normal activity or to do active work. 60   - Requires occasional assistance, but is able to care for most of his personal needs. 50   - Requires considerable assistance and frequent medical care. 49   - Disabled; requires special care and assistance. 71   - Severely disabled; hospital admission is indicated although death not imminent. 7   - Very sick; hospital admission necessary; active supportive treatment necessary. 10   - Moribund; fatal processes progressing rapidly. 0     - Dead  Karnofsky DA, Abelmann [place], Craver LS and Burchenal [facility] [phone]) The use of the nitrogen mustards in the palliative treatment of carcinoma: with particular reference to bronchogenic carcinoma Cancer 1 [phone]  LABORATORY DATA:  Lab Results  Component Value Date   WBC 6.4 03/14/2020   HGB 16.3 03/14/2020   HCT 49.2 03/14/2020   MCV 89.1 03/14/2020   PLT 256.0 03/14/2020   Lab Results  Component Value Date   NA 140 03/14/2020   K 4.6 03/14/2020   CL 104 03/14/2020   CO2 28 03/14/2020   Lab Results  Component Value Date   ALT 19 09/11/2020   AST 21 09/11/2020   ALKPHOS 45 09/11/2020   BILITOT 1.0 09/11/2020     RADIOGRAPHY: No results found.    IMPRESSION/PLAN: 1. 70 y.o. gentleman with Stage T1c adenocarcinoma of the prostate with Gleason Score of 3+4, and PSA of 4.92. We discussed the patient's workup and outlined the nature of prostate cancer in this setting. The patient's T stage, Gleason's score, and PSA put him into the favorable intermediate risk group. Accordingly, he is eligible for a variety of  potential treatment  options including brachytherapy, 5.5 weeks of external radiation, or prostatectomy. We discussed the available radiation techniques, and focused on the details and logistics of delivery. We discussed and outlined the risks, benefits, short and long-term effects associated with radiotherapy and compared and contrasted these with prostatectomy. We discussed the role of SpaceOAR gel in reducing the rectal toxicity associated with radiotherapy.  Today I reviewed the findings and workup thus far.  We discussed the natural history of prostate cancer.  We reviewed the the implications of T-stage, Gleason's Score, and PSA on decision-making and outcomes related to prostate cancer.  We discussed radiation treatment in the management of prostate cancer with regard to the logistics and delivery of external beam radiation treatment as well as the logistics and delivery of prostate brachytherapy.  We compared and contrasted each of these approaches and also compared these against prostatectomy.    The patient focused most of his questions and interest in robotic-assisted laparoscopic radical prostatectomy.  We discussed some of the potential advantages of surgery including surgical staging, the availability of salvage radiotherapy to the prostatic fossa, and the confidence associated with immediate biochemical response.  We discussed some of the potential proven indications for postoperative radiotherapy including positive margins, extracapsular extension, and seminal vesicle involvement. We also talked about some of the other potential findings leading to a recommendation for radiotherapy including a non-zero postoperative PSA and positive lymph nodes. He appears to have a good understanding of his disease and our treatment recommendations which are of curative intent.  He was encouraged to ask questions that were answered to his stated satisfaction.  At the conclusion of our conversation, the patient is interested in  proceeding with prostatectomy. We enjoyed meeting with him and his son today, and will look forward to following his progress. We will share our discussion with Dr. Alinda Money so that he can move forward with scheduling surgery accordingly.  We personally spent 70 minutes in this encounter including chart review, reviewing radiological studies, meeting face-to-face with the patient, entering orders and completing documentation.    Nicholos Johns, PA-C    Tyler Pita, MD  [facility] Direct Dial: [phone]  Fax: [phone] [email or web address]  Skype  LinkedIn   This document serves as a record of services personally performed by Tyler Pita, MD and Freeman Caldron, PA-C. It was created on their behalf by Wilburn Mylar, a trained medical scribe. The creation of this record is based on the scribe's personal observations and the provider's statements to them. This document has been checked and approved by the attending provider.

## 2021-04-02 NOTE — Progress Notes (Signed)
Introduced myself to patient, and his son, as the prostate nurse navigator and discussed my role.  No barriers to care identified at this time.  He is here to discuss his radiation treatment options.  I gave him my business card and asked him to call me with questions or concerns.  Verbalized understanding.

## 2021-04-16 ENCOUNTER — Encounter: Payer: Self-pay | Admitting: Family Medicine

## 2021-05-08 ENCOUNTER — Telehealth: Payer: Self-pay | Admitting: *Deleted

## 2021-05-08 NOTE — Telephone Encounter (Signed)
CALLED PATIENT TO ASK QUESTIONS, SPOKE WITH PATIENT 

## 2021-05-14 ENCOUNTER — Telehealth: Payer: Self-pay | Admitting: *Deleted

## 2021-05-14 NOTE — Telephone Encounter (Signed)
CALLED PATIENT TO UPDATE, SPOKE WITH PATIENT 

## 2021-05-22 ENCOUNTER — Telehealth: Payer: Self-pay | Admitting: *Deleted

## 2021-05-22 NOTE — Telephone Encounter (Signed)
CALLED PATIENT TO REMIND OF PRE-SEED APPTS. FOR 05-23-21, LVM FOR A RETURN CALL

## 2021-05-23 ENCOUNTER — Other Ambulatory Visit: Payer: Self-pay

## 2021-05-23 ENCOUNTER — Encounter: Payer: Self-pay | Admitting: Urology

## 2021-05-23 ENCOUNTER — Ambulatory Visit
Admission: RE | Admit: 2021-05-23 | Discharge: 2021-05-23 | Disposition: A | Discharge status: Home / Self Care | Payer: Medicare Other | Source: Ambulatory Visit | Attending: Urology | Admitting: Urology

## 2021-05-23 ENCOUNTER — Ambulatory Visit
Admission: RE | Admit: 2021-05-23 | Discharge: 2021-05-23 | Disposition: A | Discharge status: Home / Self Care | Payer: Medicare Other | Source: Ambulatory Visit | Attending: Radiation Oncology | Admitting: Radiation Oncology

## 2021-05-23 VITALS — Resp 19 | Ht 70.0 in | Wt 175.4 lb

## 2021-05-23 DIAGNOSIS — Z510 Encounter for antineoplastic radiation therapy: Secondary | ICD-10-CM | POA: Insufficient documentation

## 2021-05-23 DIAGNOSIS — C61 Malignant neoplasm of prostate: Secondary | ICD-10-CM | POA: Insufficient documentation

## 2021-05-23 NOTE — Progress Notes (Signed)
°  Radiation Oncology         [phone]) [phone] ________________________________  Name: Jason Hansen. MRN: [id number]  Date: 05/23/2021  DOB: 08/06/1950  SIMULATION AND TREATMENT PLANNING NOTE PUBIC ARCH STUDY  CC:McGowen, Adrian Blackwater, MD  Raynelle Bring, MD  DIAGNOSIS: 71 y.o. gentleman with Stage T1c adenocarcinoma of the prostate with Gleason score of 3+4, and PSA of 4.92.  Oncology History  Malignant neoplasm of prostate ([place])  02/14/2021 Cancer Staging   Staging form: Prostate, AJCC 8th Edition - Clinical stage from 02/14/2021: Stage IIB (cT1c, cN0, cM0, PSA: 4.9, Grade Group: 2) - Signed by Freeman Caldron, PA-C on 04/02/2021 Histopathologic type: Adenocarcinoma, NOS Stage prefix: Initial diagnosis Prostate specific antigen (PSA) range: Less than 10 Gleason primary pattern: 3 Gleason secondary pattern: 4 Gleason score: 7 Histologic grading system: 5 grade system Number of biopsy cores examined: 20 Number of biopsy cores positive: 6 Location of positive needle core biopsies: Both sides    04/02/2021 Initial Diagnosis   Malignant neoplasm of prostate ([place])       ICD-10-CM   1. Malignant neoplasm of prostate ([place])  C61       COMPLEX SIMULATION:  The patient presented today for evaluation for possible prostate seed implant. He was brought to the radiation planning suite and placed supine on the CT couch. A 3-dimensional image study set was obtained in upload to the planning computer. There, on each axial slice, I contoured the prostate gland. Then, using three-dimensional radiation planning tools I reconstructed the prostate in view of the structures from the transperineal needle pathway to assess for possible pubic arch interference. In doing so, I did not appreciate any pubic arch interference. Also, the patient's prostate volume was estimated based on the drawn structure. The volume was 50.5 cc.  Given the pubic arch appearance and prostate volume, patient remains a good  candidate to proceed with prostate seed implant. Today, he freely provided informed written consent to proceed.    PLAN: The patient will undergo prostate seed implant.   ________________________________  Sheral Apley. Tammi Klippel, M.D.

## 2021-05-23 NOTE — Progress Notes (Signed)
Spoke w/ patient, verified identity, and begin nursing interview w/ spouse 'Abhishek Levesque' in attendance. Patient states "Doing well. No symptoms to report at this time."  Meaningful use complete. No current urinary management medications.  Urology appointment- None to date  Resp 19    Ht 5\' 10"  (1.778 m)    Wt 175 lb 6.4 oz (79.6 kg)    BMI 25.17 kg/m

## 2021-05-24 ENCOUNTER — Telehealth: Payer: Self-pay | Admitting: *Deleted

## 2021-05-24 NOTE — Telephone Encounter (Signed)
Called patient to update, spoke with patient. 

## 2021-05-29 ENCOUNTER — Other Ambulatory Visit: Payer: Self-pay | Admitting: Urology

## 2021-05-29 ENCOUNTER — Telehealth: Payer: Self-pay | Admitting: *Deleted

## 2021-05-29 NOTE — Telephone Encounter (Signed)
Called patient to inform of implant date, spoke with patient and he is aware of this date. 

## 2021-05-29 NOTE — Progress Notes (Signed)
Reviewed pt chart if ekg or cxr needed prior to surgery scheduled for 07-15-2021 by Dr Alinda Money for Radioactive prostate seed implants.  Pt medical history does not indicate ekg/ cxr needed per anesthesia.

## 2021-05-30 ENCOUNTER — Other Ambulatory Visit (HOSPITAL_COMMUNITY): Payer: Medicare Other

## 2021-07-10 ENCOUNTER — Encounter (HOSPITAL_BASED_OUTPATIENT_CLINIC_OR_DEPARTMENT_OTHER): Payer: Self-pay | Admitting: Urology

## 2021-07-10 ENCOUNTER — Other Ambulatory Visit: Payer: Self-pay

## 2021-07-10 NOTE — Progress Notes (Signed)
Spoke w/ via phone for pre-op interview---pt ?Lab needs dos----  none             ?Lab results------none ?COVID test -----patient states asymptomatic no test needed ?Arrive at -------1015 07-15-2021 ?NPO after MN NO Solid Food.  Clear liquids from MN until---915 ?Med rec completed ?Medications to take morning of surgery -----none ?Diabetic medication -----n/a ?Patient instructed no nail polish to be worn day of surgery ?Patient instructed to bring photo id and insurance card day of surgery ?Patient aware to have Driver (ride ) / caregiver  wife sandy will stay   for 24 hours after surgery  ?Patient Special Instructions -----fleets enema 815 am day of surgery ?Pre-Op special Istructions -----none ?Patient verbalized understanding of instructions that were given at this phone interview. ?Patient denies shortness of breath, chest pain, fever, cough at this phone interview.  ?

## 2021-07-11 ENCOUNTER — Inpatient Hospital Stay (HOSPITAL_COMMUNITY): Admission: RE | Admit: 2021-07-11 | Payer: Medicare Other | Source: Ambulatory Visit

## 2021-07-12 ENCOUNTER — Telehealth: Payer: Self-pay | Admitting: *Deleted

## 2021-07-12 NOTE — Telephone Encounter (Signed)
CALLED PATIENT TO REMIND OF PROCEDURE FOR 07-15-21, SPOKE WITH PATIENT AND HE IS AWARE OF THIS APPT. ?

## 2021-07-12 NOTE — H&P (Signed)
CC: Prostate Cancer  ? ?PCP: Dr. Shawnie Dapper  ? ?[name] is a 71 year old gentleman who was found to have an elevated PSA of 4.92 prompting an MRI of the prostate on 01/09/21 that demonstrated a PI-RADS 5 lesion at the right posterolateral peripheral zone and a separate PI-RADS 3 lesion at the right base. He underwent an MR/US fusion TRUS biopsy on 02/14/21 that confirmed Gleason 3+4=7 adenocarcinoma with a total of a total of 6 out of 20 biopsies positive for malignancy.  ? ?Family history: None.  ? ?Imaging studies: MRI (01/09/21) - No EPE, SVI, LAD, or bone lesions.  ? ?PMH: He has a history of hyperlipidemia.  ?PSH: No abdominal surgeries.  ? ?TNM stage: cT1c N0 Mx  ?PSA: 4.92  ?Gleason score: 3+4=7 (GG 2)  ?Biopsy (02/14/21): 6/20 cores positive  ?Left: L base (5%, 3+3=6)  ?Right: R lateral apex (10%, 3+3=6), R lateral mid (50%, 3+4=7), R base (20%, 3+3=6), R lateral base (10%, 3+3=6)  ?MR targets: 1/8 biopsies positive (10%, 3+3=6)  ?Prostate volume: 50.2 cc  ? ?Nomogram  ?OC disease: 65%  ?EPE: 34%  ?SVI: 2%  ?LNI: 3%  ?PFS (5 year, 10 year): 87%, 78%  ? ?Urinary function: IPSS is 4.  ?Erectile function: SHIM score is 17.  ? ?  ?ALLERGIES: None  ? ?MEDICATIONS: Levofloxacin 750 mg tablet Please take one tablet the morning of your biopsy.  ?Sildenafil Citrate 100 mg tablet 1 tablet PO as directed PRN  ?Atorvastatin Calcium 40 mg tablet  ?Co Q10  ?  ? ?GU PSH: Prostate Needle Biopsy - 02/14/2021 ?Ureteroscopic laser litho, Left - 2019 ? ?  ? ?NON-GU PSH: Partial Remove Thyroid - 2015 ?Surgical Pathology, Gross And Microscopic Examination For Prostate Needle - 02/14/2021 ? ?  ? 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  ?   ?PMH Notes:  ? ?1) Urolithiasis: He has a history of recurrent urolithiasis. He presented to me in October 2019 with a left ureteral stone.  ? ?Oct 2019: L ureteroscopic laser lithotripsy (calcium  oxalate monohydrate)  ?Nov 2019: 24 hr urine - discrepancy between specimens (unreliable), increase fluid hydration  ? ?2) Prostate cancer  ? ?NON-GU PMH: Hypercholesterolemia ?  ? ?FAMILY HISTORY: 2 daughters - Daughter ?Death In The Family Father - Father ?Death In The Family Mother - Mother ?nephrolithiasis - Runs in Family  ? ?SOCIAL HISTORY: Marital Status: Married ?Preferred Language: [place]; Ethnicity: Not Hispanic Or Latino; Race: White ?Current Smoking Status: Patient does not smoke anymore. Has not smoked since 11/19/1977. Smoked for 10 years. Smoked 1 pack per day.  ? ?Tobacco Use Assessment Completed: Used Tobacco in last 30 days? ?Drinks 3 caffeinated drinks per day. ?Patient's occupation is/was retired. ?  ? ?REVIEW OF SYSTEMS:    ?GU Review Male:   Patient denies frequent urination, hard to postpone urination, burning/ pain with urination, get up at night to urinate, leakage of urine, stream starts and stops, trouble starting your streams, and have to strain to urinate .  ?Gastrointestinal (Lower):   Patient denies diarrhea and constipation.  ?Gastrointestinal (Upper):   Patient denies nausea and vomiting.  ?Constitutional:   Patient denies fever, night sweats, weight loss, and fatigue.  ?Skin:   Patient denies skin rash/ lesion and itching.  ?Eyes:   Patient denies blurred vision and double vision.  ?Ears/ Nose/ Throat:   Patient denies sore throat and sinus  problems.  ?Hematologic/Lymphatic:   Patient denies swollen glands and easy bruising.  ?Cardiovascular:   Patient denies leg swelling and chest pains.  ?Respiratory:   Patient denies cough and shortness of breath.  ?Endocrine:   Patient denies excessive thirst.  ?Musculoskeletal:   Patient denies back pain and joint pain.  ?Neurological:   Patient denies headaches and dizziness.  ?Psychologic:   Patient denies depression and anxiety.  ? ?VITAL SIGNS:    ? ?Weight 170 lb / 77.11 kg  ?Height 67 in / 170.18 cm  ?BMI 26.6 kg/m?  ? ?MULTI-SYSTEM  PHYSICAL EXAMINATION:    ?Constitutional: Well-nourished. No physical deformities. Normally developed. Good grooming.  ?Respiratory: No labored breathing, no use of accessory muscles. Clear bilaterally.  ?Cardiovascular: Normal temperature, normal extremity pulses, no swelling, no varicosities. Regular rate and rhythm.  ?Gastrointestinal: No mass, no tenderness, no rigidity, non obese abdomen.   ? ? ? ? ?ASSESSMENT:  ?    ICD-10 Details  ?1 GU:   Prostate Cancer - C61   ? ?PLAN:    ? 1. Favorable intermediate risk prostate cancer: He has elected brachytherapy for treatment. I discussed the potential benefits and risks of the procedure, side effects of the proposed treatment, the likelihood of the patient achieving the goals of the procedure, and any potential problems that might occur during the procedure or recuperation.  ? ? ?    ?

## 2021-07-14 NOTE — Progress Notes (Signed)
?  Radiation Oncology         [phone]) [phone] ?________________________________ ? ?Name: Jason Hansen. MRN: [id number]  ?Date: 07/14/2021  DOB: 17-Aug-1950 ? ?     Prostate Seed Implant ? ?LK:TGYBWLS, Jason Blackwater, MD  No ref. provider found ? ?DIAGNOSIS: 71 y.o. gentleman with Stage T1c adenocarcinoma of the prostate with Gleason score of 3+4, and PSA of 4.92. ? ?Oncology History  ?Malignant neoplasm of prostate ([place])  ?02/14/2021 Cancer Staging  ? Staging form: Prostate, AJCC 8th Edition ?- Clinical stage from 02/14/2021: Stage IIB (cT1c, cN0, cM0, PSA: 4.9, Grade Group: 2) - Signed by Freeman Caldron, PA-C on 04/02/2021 ?Histopathologic type: Adenocarcinoma, NOS ?Stage prefix: Initial diagnosis ?Prostate specific antigen (PSA) range: Less than 10 ?Gleason primary pattern: 3 ?Gleason secondary pattern: 4 ?Gleason score: 7 ?Histologic grading system: 5 grade system ?Number of biopsy cores examined: 20 ?Number of biopsy cores positive: 6 ?Location of positive needle core biopsies: Both sides ? ?  ?04/02/2021 Initial Diagnosis  ? Malignant neoplasm of prostate ([place]) ?  ? ? ?No diagnosis found. ? ?PROCEDURE: Insertion of radioactive I-125 seeds into the prostate gland. ? ?RADIATION DOSE: 145 Gy, definitive therapy. ? ?TECHNIQUE: Allyn Bertoni. was brought to the operating room with the urologist. He was placed in the dorsolithotomy position. He was catheterized and a rectal tube was inserted. The perineum was shaved, prepped and draped. The ultrasound probe was then introduced into the rectum to see the prostate gland. ? ?TREATMENT DEVICE: A needle grid was attached to the ultrasound probe stand and anchor needles were placed. ? ?3D PLANNING: The prostate was imaged in 3D using a sagittal sweep of the prostate probe. These images were transferred to the planning computer. There, the prostate, urethra and rectum were defined on each axial reconstructed image. Then, the software created an optimized 3D plan and a  few seed positions were adjusted. The quality of the plan was reviewed using [facility] information for the target and the following two organs at risk:  Urethra and Rectum.  Then the accepted plan was printed and handed off to the radiation therapist.  Under my supervision, the custom loading of the seeds and spacers was carried out and loaded into sealed vicryl sleeves.  These pre-loaded needles were then placed into the needle holder.. ? ?PROSTATE VOLUME STUDY:  Using transrectal ultrasound the volume of the prostate was verified to be 51.3 cc. ? ?SPECIAL TREATMENT PROCEDURE/SUPERVISION AND HANDLING: The pre-loaded needles were then delivered under sagittal guidance. A total of 18 needles were used to deposit 63 seeds in the prostate gland. The individual seed activity was 0.526 mCi. ? ?SpaceOAR:  Yes ? ?COMPLEX SIMULATION: At the end of the procedure, an anterior radiograph of the pelvis was obtained to document seed positioning and count. Cystoscopy was performed to check the urethra and bladder. ? ?MICRODOSIMETRY: At the end of the procedure, the patient was emitting 0.169 mR/hr at 1 meter. Accordingly, he was considered safe for hospital discharge. ? ?PLAN: The patient will return to the radiation oncology clinic for post implant CT dosimetry in three weeks. ? ? ?________________________________ ? ?Sheral Apley Tammi Klippel, M.D. ? ? ? ?

## 2021-07-15 ENCOUNTER — Encounter (HOSPITAL_BASED_OUTPATIENT_CLINIC_OR_DEPARTMENT_OTHER)
Admission: RE | Disposition: A | Discharge status: Home / Self Care | Payer: Self-pay | Source: Home / Self Care | Attending: Urology

## 2021-07-15 ENCOUNTER — Encounter (HOSPITAL_BASED_OUTPATIENT_CLINIC_OR_DEPARTMENT_OTHER): Payer: Self-pay | Admitting: Urology

## 2021-07-15 ENCOUNTER — Ambulatory Visit (HOSPITAL_BASED_OUTPATIENT_CLINIC_OR_DEPARTMENT_OTHER): Payer: Medicare Other | Admitting: Anesthesiology

## 2021-07-15 ENCOUNTER — Ambulatory Visit (HOSPITAL_COMMUNITY): Payer: Medicare Other

## 2021-07-15 ENCOUNTER — Ambulatory Visit (HOSPITAL_BASED_OUTPATIENT_CLINIC_OR_DEPARTMENT_OTHER)
Admission: RE | Admit: 2021-07-15 | Discharge: 2021-07-15 | Disposition: A | Discharge status: Home / Self Care | Payer: Medicare Other | Attending: Urology | Admitting: Urology

## 2021-07-15 DIAGNOSIS — Z87891 Personal history of nicotine dependence: Secondary | ICD-10-CM | POA: Insufficient documentation

## 2021-07-15 DIAGNOSIS — C61 Malignant neoplasm of prostate: Secondary | ICD-10-CM

## 2021-07-15 HISTORY — PX: RADIOACTIVE SEED IMPLANT: SHX5150

## 2021-07-15 HISTORY — DX: Presence of spectacles and contact lenses: Z97.3

## 2021-07-15 HISTORY — PX: SPACE OAR INSTILLATION: SHX6769

## 2021-07-15 SURGERY — INSERTION, RADIATION SOURCE, PROSTATE
Anesthesia: General | Site: Prostate

## 2021-07-15 MED ORDER — ONDANSETRON HCL 4 MG/2ML IJ SOLN
INTRAMUSCULAR | Status: AC
  Filled 2021-07-15: qty 2

## 2021-07-15 MED ORDER — PROPOFOL 10 MG/ML IV BOLUS
INTRAVENOUS | Status: AC
  Filled 2021-07-15: qty 20

## 2021-07-15 MED ORDER — DEXAMETHASONE SODIUM PHOSPHATE 10 MG/ML IJ SOLN
INTRAMUSCULAR | Status: AC
  Filled 2021-07-15: qty 1

## 2021-07-15 MED ORDER — DOCUSATE SODIUM 100 MG PO CAPS: 100.0000 mg | ORAL_CAPSULE | Freq: Two times a day (BID) | ORAL | 0 refills | Status: DC

## 2021-07-15 MED ORDER — PHENYLEPHRINE HCL-NACL 20-0.9 MG/250ML-% IV SOLN
INTRAVENOUS | Status: DC | PRN
  Administered 2021-07-15: 40 ug/min via INTRAVENOUS

## 2021-07-15 MED ORDER — LACTATED RINGERS IV SOLN: INTRAVENOUS | Status: DC

## 2021-07-15 MED ORDER — EPHEDRINE SULFATE (PRESSORS) 50 MG/ML IJ SOLN
INTRAMUSCULAR | Status: DC | PRN
  Administered 2021-07-15: 15 mg via INTRAVENOUS
  Administered 2021-07-15: 10 mg via INTRAVENOUS

## 2021-07-15 MED ORDER — ONDANSETRON HCL 4 MG/2ML IJ SOLN
INTRAMUSCULAR | Status: DC | PRN
  Administered 2021-07-15: 4 mg via INTRAVENOUS

## 2021-07-15 MED ORDER — ROCURONIUM BROMIDE 10 MG/ML (PF) SYRINGE
PREFILLED_SYRINGE | INTRAVENOUS | Status: DC | PRN
  Administered 2021-07-15: 50 mg via INTRAVENOUS

## 2021-07-15 MED ORDER — STERILE WATER FOR IRRIGATION IR SOLN
Status: DC | PRN
  Administered 2021-07-15: 3 mL

## 2021-07-15 MED ORDER — SODIUM CHLORIDE (PF) 0.9 % IJ SOLN
INTRAMUSCULAR | Status: DC | PRN
  Administered 2021-07-15: 10 mL

## 2021-07-15 MED ORDER — IOHEXOL 300 MG/ML  SOLN
INTRAMUSCULAR | Status: DC | PRN
  Administered 2021-07-15: 7 mL

## 2021-07-15 MED ORDER — PROPOFOL 10 MG/ML IV BOLUS
INTRAVENOUS | Status: DC | PRN
Start: 2021-07-15 — End: 2021-07-15
  Administered 2021-07-15: 40 mg via INTRAVENOUS

## 2021-07-15 MED ORDER — ROCURONIUM BROMIDE 10 MG/ML (PF) SYRINGE
PREFILLED_SYRINGE | INTRAVENOUS | Status: AC
  Filled 2021-07-15: qty 10

## 2021-07-15 MED ORDER — KETOROLAC TROMETHAMINE 30 MG/ML IJ SOLN
INTRAMUSCULAR | Status: DC | PRN
  Administered 2021-07-15: 30 mg via INTRAVENOUS

## 2021-07-15 MED ORDER — TRAMADOL HCL 50 MG PO TABS: 50.0000 mg | ORAL_TABLET | Freq: Four times a day (QID) | ORAL | 0 refills | Status: DC | PRN

## 2021-07-15 MED ORDER — GLYCOPYRROLATE 0.2 MG/ML IJ SOLN
INTRAMUSCULAR | Status: DC | PRN
  Administered 2021-07-15: .2 mg via INTRAVENOUS

## 2021-07-15 MED ORDER — FENTANYL CITRATE (PF) 100 MCG/2ML IJ SOLN
INTRAMUSCULAR | Status: DC | PRN
  Administered 2021-07-15 (×2): 50 ug via INTRAVENOUS

## 2021-07-15 MED ORDER — CIPROFLOXACIN IN D5W 400 MG/200ML IV SOLN
INTRAVENOUS | Status: AC
  Filled 2021-07-15: qty 200

## 2021-07-15 MED ORDER — PHENYLEPHRINE 40 MCG/ML (10ML) SYRINGE FOR IV PUSH (FOR BLOOD PRESSURE SUPPORT)
PREFILLED_SYRINGE | INTRAVENOUS | Status: DC | PRN
  Administered 2021-07-15: 80 ug via INTRAVENOUS
  Administered 2021-07-15: 40 ug via INTRAVENOUS

## 2021-07-15 MED ORDER — PHENYLEPHRINE 40 MCG/ML (10ML) SYRINGE FOR IV PUSH (FOR BLOOD PRESSURE SUPPORT)
PREFILLED_SYRINGE | INTRAVENOUS | Status: AC
  Filled 2021-07-15: qty 10

## 2021-07-15 MED ORDER — EPHEDRINE 5 MG/ML INJ
INTRAVENOUS | Status: AC
  Filled 2021-07-15: qty 5

## 2021-07-15 MED ORDER — CIPROFLOXACIN IN D5W 400 MG/200ML IV SOLN
400.0000 mg | INTRAVENOUS | Status: AC
  Administered 2021-07-15: 400 mg via INTRAVENOUS

## 2021-07-15 MED ORDER — FLEET ENEMA 7-19 GM/118ML RE ENEM: 1.0000 | ENEMA | Freq: Once | RECTAL | Status: DC

## 2021-07-15 MED ORDER — SUGAMMADEX SODIUM 200 MG/2ML IV SOLN
INTRAVENOUS | Status: DC | PRN
  Administered 2021-07-15: 200 mg via INTRAVENOUS

## 2021-07-15 MED ORDER — PHENYLEPHRINE HCL (PRESSORS) 10 MG/ML IV SOLN
INTRAVENOUS | Status: AC
  Filled 2021-07-15: qty 1

## 2021-07-15 MED ORDER — GLYCOPYRROLATE PF 0.2 MG/ML IJ SOSY
PREFILLED_SYRINGE | INTRAMUSCULAR | Status: AC
  Filled 2021-07-15: qty 1

## 2021-07-15 MED ORDER — LIDOCAINE 2% (20 MG/ML) 5 ML SYRINGE
INTRAMUSCULAR | Status: DC | PRN
  Administered 2021-07-15: 100 mg via INTRAVENOUS

## 2021-07-15 MED ORDER — LIDOCAINE HCL (PF) 2 % IJ SOLN
INTRAMUSCULAR | Status: AC
  Filled 2021-07-15: qty 5

## 2021-07-15 MED ORDER — TAMSULOSIN HCL 0.4 MG PO CAPS: 0.4000 mg | ORAL_CAPSULE | Freq: Every day | ORAL | 0 refills | Status: DC

## 2021-07-15 MED ORDER — SODIUM CHLORIDE 0.9 % IV SOLN
INTRAVENOUS | Status: AC | PRN
  Administered 2021-07-15: 1000 mL

## 2021-07-15 MED ORDER — DEXAMETHASONE SODIUM PHOSPHATE 10 MG/ML IJ SOLN
INTRAMUSCULAR | Status: DC | PRN
  Administered 2021-07-15: 10 mg via INTRAVENOUS

## 2021-07-15 MED ORDER — FENTANYL CITRATE (PF) 100 MCG/2ML IJ SOLN
INTRAMUSCULAR | Status: AC
Start: 2021-07-15 — End: ?
  Filled 2021-07-15: qty 2

## 2021-07-15 SURGICAL SUPPLY — 44 items
BAG DRN RND TRDRP ANRFLXCHMBR (UROLOGICAL SUPPLIES) ×2
BAG URINE DRAIN 2000ML AR STRL (UROLOGICAL SUPPLIES) ×3 IMPLANT
BLADE CLIPPER SENSICLIP SURGIC (BLADE) ×3 IMPLANT
CATH FOLEY 2WAY SLVR  5CC 16FR (CATHETERS) ×3
CATH FOLEY 2WAY SLVR 5CC 16FR (CATHETERS) ×2 IMPLANT
CATH ROBINSON RED A/P 16FR (CATHETERS) IMPLANT
CATH ROBINSON RED A/P 20FR (CATHETERS) ×3 IMPLANT
CLOTH BEACON ORANGE TIMEOUT ST (SAFETY) ×3 IMPLANT
CNTNR URN SCR LID CUP LEK RST (MISCELLANEOUS) ×2 IMPLANT
CONT SPEC 4OZ STRL OR WHT (MISCELLANEOUS) ×3
COVER BACK TABLE 60X90IN (DRAPES) ×3 IMPLANT
COVER MAYO STAND STRL (DRAPES) ×3 IMPLANT
DRSG TEGADERM 4X4.75 (GAUZE/BANDAGES/DRESSINGS) ×3 IMPLANT
DRSG TEGADERM 8X12 (GAUZE/BANDAGES/DRESSINGS) ×3 IMPLANT
GAUZE SPONGE 4X4 12PLY STRL LF (GAUZE/BANDAGES/DRESSINGS) ×1 IMPLANT
GEL ULTRASOUND 20GR AQUASONIC (MISCELLANEOUS) ×3 IMPLANT
GLOVE SURG ENC MOIS LTX SZ6.5 (GLOVE) ×3 IMPLANT
GLOVE SURG ENC MOIS LTX SZ7.5 (GLOVE) ×6 IMPLANT
GLOVE SURG ENC MOIS LTX SZ8 (GLOVE) IMPLANT
GLOVE SURG ORTHO LTX SZ8.5 (GLOVE) IMPLANT
GLOVE SURG UNDER POLY LF SZ6.5 (GLOVE) IMPLANT
GOWN STRL REUS W/TWL LRG LVL3 (GOWN DISPOSABLE) ×3 IMPLANT
GRID BRACH TEMP 18GA 2.8X3X.75 (MISCELLANEOUS) ×3 IMPLANT
HOLDER FOLEY CATH W/STRAP (MISCELLANEOUS) ×3 IMPLANT
IMPL SPACEOAR VUE SYSTEM (Spacer) ×2 IMPLANT
IMPLANT SPACEOAR VUE SYSTEM (Spacer) ×3 IMPLANT
IV NS 1000ML (IV SOLUTION) ×3
IV NS 1000ML BAXH (IV SOLUTION) ×2 IMPLANT
KIT TURNOVER CYSTO (KITS) ×3 IMPLANT
NDL BRACHY 18G 5PK (NEEDLE) ×8 IMPLANT
NDL BRACHY 18G SINGLE (NEEDLE) IMPLANT
NDL PK MORGANSTERN STABILIZ (NEEDLE) ×2 IMPLANT
NEEDLE BRACHY 18G 5PK (NEEDLE) ×12 IMPLANT
NEEDLE BRACHY 18G SINGLE (NEEDLE) IMPLANT
NEEDLE PK MORGANSTERN STABILIZ (NEEDLE) ×3 IMPLANT
PACK CYSTO (CUSTOM PROCEDURE TRAY) ×3 IMPLANT
SHEATH ULTRASOUND LF (SHEATH) IMPLANT
SHEATH ULTRASOUND LTX NONSTRL (SHEATH) IMPLANT
SUT BONE WAX W31G (SUTURE) IMPLANT
SYR 10ML LL (SYRINGE) ×3 IMPLANT
TOWEL OR 17X26 10 PK STRL BLUE (TOWEL DISPOSABLE) ×3 IMPLANT
UNDERPAD 30X36 HEAVY ABSORB (UNDERPADS AND DIAPERS) ×6 IMPLANT
WATER STERILE IRR 500ML POUR (IV SOLUTION) ×3 IMPLANT
bard quicklink brachy  I-125 seeds ×63 IMPLANT

## 2021-07-15 NOTE — Discharge Instructions (Addendum)
You will be prescribed tamsulosin which is a medication to help you urinate over the next month.  You should call Dr. Lynne Logan office [phone]) if you feel you cannot empty your bladder well. Also, call if you develop fever > 101. ? ?You will also be prescribed pain medication and should take an over the counter stool softener over the next week to avoid straining with bowel movements. ? ?Followup with Dr. Alinda Money and your radiation oncologist as scheduled. ?PROSTATE CANCER TREATMENT ?WITH RADIOACTIVE IODINE-125 SEED IMPLANT ? ?This instruction sheet is intended to discuss implantation of Iodine-125 seeds as treatment for cancer of the prostate. It will explain in detail what you may expect from this treatment and what precautions are necessary as a result of the treatment. Iodine-125 emits a relatively low energy radiation. The radioactive seeds are surgically implanted directly into the prostate gland. Most of ?the radiation is contained within the prostate gland. A very small amount is present outside the body.The precautions that we ask you to take are to ensure that those around you are protected from unnecessary radiation. The principles of radiation safety that you need to understand are: ? ?DISTANCE: The further a person is from the radioactive implant the less radiation they will be receiving. The amount of radiation received falls off quite rapidly with distance. More specific guidelines are given in the table on the last page. ? ?TIME: The amount of radiation a person is exposed to is directly proportional to the amount of time that is spent in close proximity to the radioactive implant. Very little radiation will be received during short periods. See the table on the last page for more specific guideline. ? ?CHILDREN UNDER AGE 21 ?Children should not be allowed to sit on your lap or otherwise be in very close contact for more than a few minutes for the first 6-8 weeks following the implant. You may  affectionately greet (hug/kiss) a child for a short period of time, but remember, the longer you are in close proximity with that child the more radiation they are being exposed to. At a distance of 6 feet there is no limit to the length of time you may spend together. See specific guidelines on the last page. ? ?PREGNANT OR POSSIBLY PREGNANT WOMEN ?Pregnant women should avoid prolonged close physical contact with you for the first 6-8 weeks after implant. At a distance of 6 feet there is no limit to the length of time you may spend together. Pregnant women or possibly pregnant women can safely be in close contact with you for a limited period of time. See the last page for guidelines. ? ?FAMILY RELATIONS ?You may sleep in the same bed as your partner (provided she is not pregnant or under the age of 76). Sexual intercourse, using a condom, may be resumed 2 weeks after the implant. Your semen may be discolored, dark brown or black. This is normal and is the result of bleeding that may have occurred during the implant. After 3-4 weeks it will not be necessary to use a condom. ? ?DAILY ACTIVITIES ?You may resume normal activities in a few days (example: work, shopping, church) without the risk of harmful radiation exposure to those around you provided you keep in mind the time and ?distance precautions. Objects that you touch or item that you use do not become radioactive. Linens, clothing, tableware, and dishes may be used by other persons without special precautions. Your bodily wastes (urine and stool) are not radioactive. ? ?SPECIAL  PRECAUTIONS ?It is possible to lose implanted Iodine-125 seed(s) through urination. Although it is possible to pass seeds indefinitely, it is most likely to occur immediately after catheter removal. To prevent ?this from happening the catheter that was in place during the implant procedure is removed immediately after the implant and a cystoscopy procedure is performed. The process of  removing the catheter and the cystoscopy procedure should dislodge and remove any seeds that are not firmly imbedded in the prostate tissue. However, you should watch for seeds if/when you remove your catheter at home. The seeds are silver colored and the size of a grain of rice. In the unlikely event that a seed is seen after urination, simply flush the seed down the toilet. The seed should not be handled with your fingers, not even with a glove or napkin. A spoon or tweezers can be used to pick up a seed. The Radiation Oncology department is open Monday - Friday from 8:00 am to 5:30 pm with a Radiation Oncologist on call at all times. He or she may be reached by calling [phone]. If you are to be hospitalized or if death should occur, your family should notify the Runner, broadcasting/film/video. ? ?SIDE EFFECTS ?There are very few side effects associate with the implant procedure. Minor burning with urination, weak stream, hesitancy, intermittency, frequency, mild pain or feeling unable to pass your urine freely are common and usually stop in one to four months. If these symptoms are extremely uncomfortable, contact your physician. ? ?RADIATION SAFETY GUIDELINES ?PROSTATE CANCER TREATMENT ?WITH RADIOACTIVE IODINE-125 SEED IMPLANT ? ?The following guidelines will limit exposure to less than naturally occurring background ?radiation. ? ?PERSONS AGE 51-45 (if able to become pregnant) ? ?FOR 8 WEEKS FOLLOWING IMPLANT ? ?At a distance of 1 foot: limit time to less than 2 hours/week ?At a distance of 3 feet: limit time to 20 hours/week ?At a distance of 6 feet: no restrictions ? ?AFTER 8 WEEKS ?No restrictions ? ?CHILDREN UNDER AGE 65, PREGNANT WOMEN OR POSSIBLY ?PREGNANT WOMEN ? ?FOR 8 WEEKS FOLLOWING IMPLANT ?At a distance of 1 foot: limit time to 10 minutes/week ?At a distance of 3 feet: limit time to 2 hours/week ?At a distance of 6 feet: no restrictions ? ?AFTER 8 WEEKS ?No restrictions ? ?PERSONS OVER THE AGE OF 45  AND DO NOT EXPECT TO HAVE ANY ?MORE CHILDREN ?No restrictions ? ?Updated by [facility] in January 2020 ? ?Post Anesthesia Home Care Instructions ? ?Activity: ?Get plenty of rest for the remainder of the day. A responsible individual must stay with you for 24 hours following the procedure.  ?For the next 24 hours, DO NOT: ?-Drive a car ?-Paediatric nurse ?-Drink alcoholic beverages ?-Take any medication unless instructed by your physician ?-Make any legal decisions or sign important papers. ? ?Meals: ?Start with liquid foods such as gelatin or soup. Progress to regular foods as tolerated. Avoid greasy, spicy, heavy foods. If nausea and/or vomiting occur, drink only clear liquids until the nausea and/or vomiting subsides. Call your physician if vomiting continues. ? ?Special Instructions/Symptoms: ?Your throat may feel dry or sore from the anesthesia or the breathing tube placed in your throat during surgery. If this causes discomfort, gargle with warm salt water. The discomfort should disappear within 24 hours. ? ?No ibuprofen, Advil, Aleve, Motrin, ketorolac, meloxicam, naproxen, or other NSAIDS until after 7:30 pm today if needed. ? ? ? ?

## 2021-07-15 NOTE — Transfer of Care (Signed)
Immediate Anesthesia Transfer of Care Note ? ?Patient: Jason Hansen. ? ?Procedure(s) Performed: RADIOACTIVE SEED IMPLANT/BRACHYTHERAPY IMPLANT WITH CYSTOSCOPY (Prostate) ?SPACE OAR INSTILLATION (Perineum) ? ?Patient Location: PACU ? ?Anesthesia Type:General ? ?Level of Consciousness: awake, alert , oriented and patient cooperative ? ?Airway & Oxygen Therapy: Patient Spontanous Breathing ? ?Post-op Assessment: Report given to RN and Post -op Vital signs reviewed and stable ? ?Post vital signs: Reviewed and stable ? ?Last Vitals:  ?Vitals Value Taken Time  ?BP 130/81 07/15/21 1334  ?Temp 36.1 ?C 07/15/21 1334  ?Pulse 60 07/15/21 1335  ?Resp 13 07/15/21 1335  ?SpO2 96 % 07/15/21 1335  ?Vitals shown include unvalidated device data. ? ?Last Pain:  ?Vitals:  ? 07/15/21 1038  ?TempSrc: Oral  ?PainSc: 0-No pain  ?   ? ?Patients Stated Pain Goal: 0 (07/15/21 1038) ? ?Complications: No notable events documented. ?

## 2021-07-15 NOTE — Anesthesia Procedure Notes (Signed)
Procedure Name: Intubation ?Date/Time: 07/15/2021 12:25 PM ?Performed by: Rogers Blocker, CRNA ?Pre-anesthesia Checklist: Patient identified, Emergency Drugs available, Suction available and Patient being monitored ?Patient Re-evaluated:Patient Re-evaluated prior to induction ?Oxygen Delivery Method: Circle System Utilized ?Preoxygenation: Pre-oxygenation with 100% oxygen ?Induction Type: IV induction ?Ventilation: Mask ventilation without difficulty ?Laryngoscope Size: Mac and 4 ?Grade View: Grade I ?Tube type: Oral ?Tube size: 7.5 mm ?Number of attempts: 1 ?Airway Equipment and Method: Stylet and Bite block ?Placement Confirmation: ETT inserted through vocal cords under direct vision, positive ETCO2 and breath sounds checked- equal and bilateral ?Secured at: 22 cm ?Tube secured with: Tape ?Dental Injury: Teeth and Oropharynx as per pre-operative assessment  ? ? ? ? ?

## 2021-07-15 NOTE — Op Note (Signed)
Preoperative diagnosis: Clinically localized adenocarcinoma of the prostate (T1c Nx Mx) ? ?Postoperative diagnosis: Clinically localized adenocarcinoma of the prostate ? ?Procedure: 1) Transperineal placement of radioactive seeds into the prostate ?                   2) Cystoscopy ?                   3) Insertion of SpaceOAR hydrogel  ? ?Surgeon: Roxy Horseman, Brooke Bonito. M.D. ? ?Radiation oncologist: Dr. Ledon Snare ? ?Anesthesia: General ? ?EBL: Minimal ? ?Complications: None ? ?Indication: Jason Hansen. is a 71 y.o. gentleman with clinically localized prostate cancer. After discussing management options for treatment, he elected to proceed with radiotherapy. He presents today for the above procedures. The potential risks, complications, alternative options, and expected recovery course have been discussed in detail with the patient and he has provided informed consent to proceed. ? ?Description of procedure: The patient was taken to the operating room and general anesthesia was induced. He was administered preoperative antibiotics, placed in the dorsal lithotomy position, and prepped and draped in the usual sterile fashion. Next, intraoperative transrectal ultrasonography was utilized for real-time intraoperative planning by the radiation oncology team. Once the treatment plan was completed and the seed strands created, stranded iodine 125 radiation seeds were placed utilizing a brachytherapy perineal template. 29 radioactive iodine 125 seeds into the prostate through 18 catheter needles.  The brachytherapy template was then removed.  A site in the midline was selected on the perineum for placement of an 18 g needle with saline.  The needle was advanced above the rectum and below Denonvillier's fascia to the mid gland and confirmed to be in the midline on transverse imaging.  One cc of saline was injected confirming appropriate expansion of this space.  A total of 5 cc of saline was then injected to open  the space further bilaterally.  The saline syringe was then removed and the SpaceOAR hydrogel was injected with good distribution bilaterally. Position of the radiation seeds was confirmed on fluoroscopic imaging.  Flexible cystoscopy was then performed and no seeds were identified within the bladder.  No bladder tumors, stones, or other mucosal pathology was identified within the bladder. He tolerated the procedure well and without complications. He was able to be transferred to the recovery unit in satisfactory condition.  He was given a voiding trial in the PACU.   ?

## 2021-07-15 NOTE — Anesthesia Preprocedure Evaluation (Addendum)
Anesthesia Evaluation  ?Patient identified by MRN, date of birth, ID band ?Patient awake ? ? ? ?Reviewed: ?Allergy & Precautions, NPO status  ? ?Airway ?Mallampati: II ? ? ? ? ? ? Dental ?  ?Pulmonary ?neg pulmonary ROS, former smoker,  ?  ?breath sounds clear to auscultation ? ? ? ? ? ? Cardiovascular ?negative cardio ROS ? ? ?Rhythm:Regular Rate:Normal ? ? ?  ?Neuro/Psych ? Neuromuscular disease   ? GI/Hepatic ?negative GI ROS, Neg liver ROS,   ?Endo/Other  ?negative endocrine ROS ? Renal/GU ?negative Renal ROS  ? ?  ?Musculoskeletal ? ?(+) Arthritis ,  ? Abdominal ?  ?Peds ? Hematology ?  ?Anesthesia Other Findings ? ? Reproductive/Obstetrics ? ?  ? ? ? ? ? ? ? ? ? ? ? ? ? ?  ?  ? ? ? ? ? ? ? ? ?Anesthesia Physical ?Anesthesia Plan ? ?ASA: 2 ? ?Anesthesia Plan: General  ? ?Post-op Pain Management:   ? ?Induction: Intravenous ? ?PONV Risk Score and Plan: 3 and Ondansetron, Dexamethasone and Midazolam ? ?Airway Management Planned: Oral ETT and LMA ? ?Additional Equipment:  ? ?Intra-op Plan:  ? ?Post-operative Plan: Extubation in OR ? ?Informed Consent: I have reviewed the patients History and Physical, chart, labs and discussed the procedure including the risks, benefits and alternatives for the proposed anesthesia with the patient or authorized representative who has indicated his/her understanding and acceptance.  ? ? ? ?Dental advisory given ? ?Plan Discussed with: CRNA and Anesthesiologist ? ?Anesthesia Plan Comments:   ? ? ? ? ? ? ?Anesthesia Quick Evaluation ? ?

## 2021-07-16 ENCOUNTER — Encounter (HOSPITAL_BASED_OUTPATIENT_CLINIC_OR_DEPARTMENT_OTHER): Payer: Self-pay | Admitting: Urology

## 2021-07-16 NOTE — Anesthesia Postprocedure Evaluation (Signed)
Anesthesia Post Note ? ?Patient: Jason Hansen. ? ?Procedure(s) Performed: RADIOACTIVE SEED IMPLANT/BRACHYTHERAPY IMPLANT WITH CYSTOSCOPY (Prostate) ?SPACE OAR INSTILLATION (Perineum) ? ?  ? ?Patient location during evaluation: PACU ?Anesthesia Type: General ?Level of consciousness: awake and alert ?Pain management: pain level controlled ?Vital Signs Assessment: post-procedure vital signs reviewed and stable ?Respiratory status: spontaneous breathing, nonlabored ventilation, respiratory function stable and patient connected to nasal cannula oxygen ?Cardiovascular status: blood pressure returned to baseline and stable ?Postop Assessment: no apparent nausea or vomiting ?Anesthetic complications: no ? ? ?No notable events documented. ?  ?  ?  ?  ?  ? ?Effie Berkshire ? ? ? ? ?

## 2021-07-17 NOTE — Addendum Note (Signed)
Addendum  created 07/17/21 0819 by Effie Berkshire, MD  ? Attestation recorded in [place], [place] filed  ?  ?

## 2021-07-22 ENCOUNTER — Telehealth: Payer: Self-pay | Admitting: *Deleted

## 2021-07-22 NOTE — Telephone Encounter (Signed)
RETURNED PATIENT'S PHONE CALL, LVM FOR A RETURN CALL 

## 2021-07-23 ENCOUNTER — Telehealth: Payer: Self-pay | Admitting: *Deleted

## 2021-07-23 NOTE — Telephone Encounter (Signed)
CALLED PATIENT TO INFORM THAT POST SEED APPTS. HAVE BEEN MOVED TO 08/16/21, SPOKE WITH PATIENT AND HE IS AWARE OF THIS ?

## 2021-07-23 NOTE — Telephone Encounter (Signed)
CALLED PATIENT TO INFORM THAT POST SEED APPTS. HAVE BEEN MOVED TO 08/16/21 ?

## 2021-08-06 ENCOUNTER — Ambulatory Visit: Payer: Medicare Other | Admitting: Radiation Oncology

## 2021-08-06 ENCOUNTER — Ambulatory Visit: Payer: Self-pay | Admitting: Urology

## 2021-08-15 ENCOUNTER — Telehealth: Payer: Self-pay | Admitting: *Deleted

## 2021-08-15 NOTE — Telephone Encounter (Signed)
Called patient to remind of post seed appts. for 08-16-21, lvm for a return call ?

## 2021-08-16 ENCOUNTER — Ambulatory Visit
Admission: RE | Admit: 2021-08-16 | Discharge: 2021-08-16 | Disposition: A | Discharge status: Home / Self Care | Payer: Medicare Other | Source: Ambulatory Visit | Attending: Urology | Admitting: Urology

## 2021-08-16 ENCOUNTER — Ambulatory Visit: Payer: Medicare Other

## 2021-08-16 ENCOUNTER — Other Ambulatory Visit: Payer: Self-pay

## 2021-08-16 ENCOUNTER — Ambulatory Visit
Admission: RE | Admit: 2021-08-16 | Discharge: 2021-08-16 | Disposition: A | Discharge status: Home / Self Care | Payer: Medicare Other | Source: Ambulatory Visit | Attending: Radiation Oncology | Admitting: Radiation Oncology

## 2021-08-16 ENCOUNTER — Encounter: Payer: Self-pay | Admitting: Urology

## 2021-08-16 VITALS — BP 129/91 | HR 70 | Temp 97.3°F | Resp 18 | Ht 65.0 in | Wt 172.0 lb

## 2021-08-16 DIAGNOSIS — R3915 Urgency of urination: Secondary | ICD-10-CM

## 2021-08-16 DIAGNOSIS — C61 Malignant neoplasm of prostate: Secondary | ICD-10-CM | POA: Insufficient documentation

## 2021-08-16 DIAGNOSIS — R300 Dysuria: Secondary | ICD-10-CM | POA: Diagnosis not present

## 2021-08-16 DIAGNOSIS — Z923 Personal history of irradiation: Secondary | ICD-10-CM | POA: Insufficient documentation

## 2021-08-16 DIAGNOSIS — Z79899 Other long term (current) drug therapy: Secondary | ICD-10-CM | POA: Insufficient documentation

## 2021-08-16 DIAGNOSIS — R350 Frequency of micturition: Secondary | ICD-10-CM | POA: Insufficient documentation

## 2021-08-16 LAB — URINALYSIS, COMPLETE (UACMP) WITH MICROSCOPIC
Bacteria, UA: NONE SEEN
Bilirubin Urine: NEGATIVE
Glucose, UA: NEGATIVE mg/dL
Hgb urine dipstick: NEGATIVE
Ketones, ur: NEGATIVE mg/dL
Leukocytes,Ua: NEGATIVE
Nitrite: NEGATIVE
Protein, ur: NEGATIVE mg/dL
Specific Gravity, Urine: 1.01 (ref 1.005–1.030)
pH: 5 (ref 5.0–8.0)

## 2021-08-16 NOTE — Progress Notes (Signed)
?Radiation Oncology         (336) [phone] ?________________________________ ? ?Name: Jason Hansen. MRN: [id number]  ?Date: 08/16/2021  DOB: 09-20-1950 ? ?Post-Seed Follow-Up Visit Note ? ?CC: McGowen, Adrian Blackwater, MD  Raynelle Bring, MD ? ?Diagnosis:   71 y.o. gentleman with Stage T1c adenocarcinoma of the prostate with Gleason score of 3+4, and PSA of 4.92. ? ?  ICD-10-CM   ?1. Malignant neoplasm of prostate ([place])  C61   ?  ? ? ?Interval Since Last Radiation:  4.5 weeks ?07/15/21:  Insertion of radioactive I-125 seeds into the prostate gland; 145 Gy, definitive therapy with placement of SpaceOAR gel. ? ?Narrative:  The patient returns today for routine follow-up.  He is complaining of increased urinary frequency and urinary hesitation symptoms. He filled out a questionnaire regarding urinary function today providing and overall IPSS score of 29 characterizing his symptoms as severe with nocturia x5, hesitancy, intermittency, weak flow of stream, urgency and frequency with some mild dysuria.  He specifically denies gross hematuria, malodorous urine, fever, chills or night sweats.  His pre-implant score was 12.  He is taking Flomax daily as prescribed following his procedure.  He denies any abdominal pain or bowel symptoms.  He reports a healthy appetite and is maintaining his weight.  He does have some decreased stamina but feels that overall, his symptoms are gradually improving and he is pleased with his progress to date. ? ?ALLERGIES:  has No Known Allergies. ? ?Meds: ?Current Outpatient Medications  ?Medication Sig Dispense Refill  ? atorvastatin (LIPITOR) 40 MG tablet Take 1 tablet (40 mg total) by mouth daily. (Patient taking differently: Take 1 tablet by mouth at bedtime.) 90 tablet 3  ? augmented betamethasone dipropionate (DIPROLENE-AF) 0.05 % cream Apply topically 2 (two) times daily as needed.    ? Coenzyme Q10 (COQ10) 100 MG CAPS Take 200 mg by mouth daily.    ? docusate sodium (COLACE) 100 MG  capsule Take 1 capsule (100 mg total) by mouth 2 (two) times daily. 30 capsule 0  ? tamsulosin (FLOMAX) 0.4 MG CAPS capsule Take 1 capsule (0.4 mg total) by mouth at bedtime. 30 capsule 0  ? traMADol (ULTRAM) 50 MG tablet Take 1-2 tablets (50-100 mg total) by mouth every 6 (six) hours as needed (pain). 15 tablet 0  ? triamcinolone (NASACORT) 55 MCG/ACT AERO nasal inhaler Place 2 sprays into the nose daily.    ? ?No current facility-administered medications for this visit.  ? ? ?Physical Findings: ?In general this is a well appearing Caucasian male in no acute distress. He's alert and oriented x4 and appropriate throughout the examination. Cardiopulmonary assessment is negative for acute distress and he exhibits normal effort.  ? ?Lab Findings: ?Lab Results  ?Component Value Date  ? WBC 6.4 03/14/2020  ? HGB 16.3 03/14/2020  ? HCT 49.2 03/14/2020  ? MCV 89.1 03/14/2020  ? PLT 256.0 03/14/2020  ? ? ?Radiographic Findings:  ?Patient underwent CT imaging in our clinic for post implant dosimetry. The CT will be reviewed by Dr. Tammi Klippel to confirm there is an adequate distribution of radioactive seeds throughout the prostate gland and ensure that there are no seeds in or near the rectum.  We suspect the final radiation plan and dosimetry will show appropriate coverage of the prostate gland. He understands that we will call and inform him of any unexpected findings on further review of his imaging and dosimetry. ? ?Impression/Plan: 71 y.o. gentleman with Stage T1c adenocarcinoma of the prostate  with Gleason score of 3+4, and PSA of 4.92. ?The patient is recovering from the effects of radiation. His urinary symptoms should gradually improve over the next 4-6 months. We talked about this today.  He will try doubling up on the Flomax to see if this gives him more relief of the LUTS.  We will also send a urinalysis and urine culture to rule out any infection.  In general, he is encouraged by his improvement already and is  otherwise pleased with his outcome. We also talked about long-term follow-up for prostate cancer following seed implant. He understands that ongoing PSA determinations and digital rectal exams will help perform surveillance to rule out disease recurrence. He has a follow up appointment scheduled with Dr. Alinda Money on 08/20/21. He understands what to expect with his PSA measures. Patient was also educated today about some of the long-term effects from radiation including a small risk for rectal bleeding and possibly erectile dysfunction. We talked about some of the general management approaches to these potential complications. However, I did encourage the patient to contact our office or return at any point if he has questions or concerns related to his previous radiation and prostate cancer. ? ? ? ?Nicholos Johns, PA-C ? ?

## 2021-08-16 NOTE — Progress Notes (Signed)
?  Radiation Oncology         [phone]) [phone] ?________________________________ ? ?Name: Jason Hansen. MRN: [id number]  ?Date: 08/16/2021  DOB: [birth year] ? ?COMPLEX SIMULATION NOTE ? ?NARRATIVE:  The patient was brought to the [facility] today following prostate seed implantation approximately one month ago.  Identity was confirmed.  All relevant records and images related to the planned course of therapy were reviewed.  Then, the patient was set-up supine.  CT images were obtained.  The CT images were loaded into the planning software.  Then the prostate and rectum were contoured.  Treatment planning then occurred.  The implanted iodine 125 seeds were identified by the physics staff for projection of radiation distribution  I have requested : 3D Simulation  I have requested a DVH of the following structures: Prostate and rectum.   ? ?________________________________ ? ?Sheral Apley Tammi Klippel, M.D. ? ?

## 2021-08-16 NOTE — Progress Notes (Signed)
Post-Seed appointment. I verified patient identity and began nursing interview w/ spouse Mrs. Parke Simmers in attendance. Patient reports urinary urgency, and dysuria 5/10. No other issues reported at this time. ? ?Meaningful use complete. ?I-PSS score of 29 (severe). ?Flomax 0.'4mg'$  as directed. ?Urology appointment- 08/20/21 ? ?BP (!) 129/91 (BP Location: Left Arm, Patient Position: Sitting, Cuff Size: Normal)   Pulse 70   Temp (!) 97.3 ?F (36.3 ?C) (Temporal)   Resp 18   Ht '5\' 5"'$  (1.651 m)   Wt 172 lb (78 kg)   SpO2 99%   BMI 28.62 kg/m?  ? ?

## 2021-08-17 LAB — URINE CULTURE: Culture: NO GROWTH

## 2021-08-19 ENCOUNTER — Telehealth: Payer: Self-pay

## 2021-08-19 NOTE — Telephone Encounter (Signed)
Verified patient identity and notified patient of "Negative Urinalysis" results. Patient expressed verbal understanding of information. ?

## 2021-08-20 DIAGNOSIS — R3912 Poor urinary stream: Secondary | ICD-10-CM | POA: Diagnosis not present

## 2021-08-21 ENCOUNTER — Ambulatory Visit
Admission: RE | Admit: 2021-08-21 | Discharge: 2021-08-21 | Disposition: A | Discharge status: Home / Self Care | Payer: Medicare Other | Source: Ambulatory Visit | Attending: Radiation Oncology | Admitting: Radiation Oncology

## 2021-08-21 ENCOUNTER — Encounter: Payer: Self-pay | Admitting: Radiation Oncology

## 2021-08-21 DIAGNOSIS — C61 Malignant neoplasm of prostate: Secondary | ICD-10-CM | POA: Insufficient documentation

## 2021-08-21 DIAGNOSIS — Z510 Encounter for antineoplastic radiation therapy: Secondary | ICD-10-CM | POA: Insufficient documentation

## 2021-08-21 DIAGNOSIS — Z191 Hormone sensitive malignancy status: Secondary | ICD-10-CM | POA: Diagnosis not present

## 2021-09-03 ENCOUNTER — Telehealth: Payer: Self-pay | Admitting: *Deleted

## 2021-09-18 ENCOUNTER — Other Ambulatory Visit: Payer: Self-pay | Admitting: Family Medicine

## 2021-09-18 DIAGNOSIS — Z77090 Contact with and (suspected) exposure to asbestos: Secondary | ICD-10-CM

## 2021-09-23 ENCOUNTER — Other Ambulatory Visit: Payer: Self-pay

## 2021-09-23 MED ORDER — SCOPOLAMINE 1 MG/3DAYS TD PT72
1.0000 | MEDICATED_PATCH | TRANSDERMAL | 1 refills | Status: DC
Start: 2021-09-23 — End: 2022-04-07

## 2021-09-23 NOTE — Telephone Encounter (Signed)
No prior record of pt being prescribed scopolamine patches. Pt last seen 10/31/20 for acute issue and 09/11/20 for RCI.

## 2021-09-23 NOTE — Telephone Encounter (Signed)
Patient refill request.  Motion sickness patch.  Wife states he has taken this medication before, I cannot find on history.  [place]  Please advise [phone]

## 2021-09-24 NOTE — Telephone Encounter (Signed)
Pt advised refill sent. °

## 2021-09-26 NOTE — Progress Notes (Signed)
  Radiation Oncology         [phone]) [phone] ________________________________  Name: Jason Hansen. MRN: [id number]  Date: 08/21/2021  DOB: [birth year]  3D Planning Note   Prostate Brachytherapy Post-Implant Dosimetry  Diagnosis: 71 y.o. gentleman with Stage T1c adenocarcinoma of the prostate with Gleason score of 3+4, and PSA of 4.92.  Narrative: On a previous date, Jason Hansen. returned following prostate seed implantation for post implant planning. He underwent CT scan complex simulation to delineate the three-dimensional structures of the pelvis and demonstrate the radiation distribution.  Since that time, the seed localization, and complex isodose planning with dose volume histograms have now been completed.  Results:   Prostate Coverage - The dose of radiation delivered to the 90% or more of the prostate gland (D90) was 110.73% of the prescription dose. This exceeds our goal of greater than 90%. Rectal Sparing - The volume of rectal tissue receiving the prescription dose or higher was 0.0 cc. This falls under our thresholds tolerance of 1.0 cc.  Impression: The prostate seed implant appears to show adequate target coverage and appropriate rectal sparing.  Plan:  The patient will continue to follow with urology for ongoing PSA determinations. I would anticipate a high likelihood for local tumor control with minimal risk for rectal morbidity.  ________________________________  Sheral Apley Tammi Klippel, M.D.

## 2021-10-08 ENCOUNTER — Other Ambulatory Visit: Payer: Self-pay | Admitting: Family Medicine

## 2021-10-08 DIAGNOSIS — Z77090 Contact with and (suspected) exposure to asbestos: Secondary | ICD-10-CM

## 2021-10-15 ENCOUNTER — Encounter: Payer: Self-pay | Admitting: Family Medicine

## 2021-11-20 ENCOUNTER — Ambulatory Visit (INDEPENDENT_AMBULATORY_CARE_PROVIDER_SITE_OTHER): Payer: Medicare Other

## 2021-11-20 DIAGNOSIS — Z0000 Encounter for general adult medical examination without abnormal findings: Secondary | ICD-10-CM

## 2021-11-20 NOTE — Patient Instructions (Signed)
Jason Hansen , Thank you for taking time to come for your Medicare Wellness Visit. I appreciate your ongoing commitment to your health goals. Please review the following plan we discussed and let me know if I can assist you in the future.   Screening recommendations/referrals: Colonoscopy: done 05/24/18 repeat ever 10 years  Recommended yearly ophthalmology/optometry visit for glaucoma screening and checkup Recommended yearly dental visit for hygiene and checkup  Vaccinations: Influenza vaccine: due  Pneumococcal vaccine: Up to date Tdap vaccine: done 11/22/14 repeat every 10 years  Shingles vaccine: Shingrix discussed. Please contact your pharmacy for coverage information.    Covid-19: completed 3/1, 08/20/19  Advanced directives: Please bring a copy of your health care power of attorney and living will to the office at your convenience.   Conditions/risks identified: continue to stay active   Next appointment: Follow up in one year for your annual wellness visit.   Preventive Care 28 Years and Older, Male Preventive care refers to lifestyle choices and visits with your health care provider that can promote health and wellness. What does preventive care include? A yearly physical exam. This is also called an annual well check. Dental exams once or twice a year. Routine eye exams. Ask your health care provider how often you should have your eyes checked. Personal lifestyle choices, including: Daily care of your teeth and gums. Regular physical activity. Eating a healthy diet. Avoiding tobacco and drug use. Limiting alcohol use. Practicing safe sex. Taking low doses of aspirin every day. Taking vitamin and mineral supplements as recommended by your health care provider. What happens during an annual well check? The services and screenings done by your health care provider during your annual well check will depend on your age, overall health, lifestyle risk factors, and family history  of disease. Counseling  Your health care provider may ask you questions about your: Alcohol use. Tobacco use. Drug use. Emotional well-being. Home and relationship well-being. Sexual activity. Eating habits. History of falls. Memory and ability to understand (cognition). Work and work Statistician. Screening  You may have the following tests or measurements: Height, weight, and BMI. Blood pressure. Lipid and cholesterol levels. These may be checked every 5 years, or more frequently if you are over 28 years old. Skin check. Lung cancer screening. You may have this screening every year starting at age 32 if you have a 30-pack-year history of smoking and currently smoke or have quit within the past 15 years. Fecal occult blood test (FOBT) of the stool. You may have this test every year starting at age 85. Flexible sigmoidoscopy or colonoscopy. You may have a sigmoidoscopy every 5 years or a colonoscopy every 10 years starting at age 52. Prostate cancer screening. Recommendations will vary depending on your family history and other risks. Hepatitis C blood test. Hepatitis B blood test. Sexually transmitted disease (STD) testing. Diabetes screening. This is done by checking your blood sugar (glucose) after you have not eaten for a while (fasting). You may have this done every 1-3 years. Abdominal aortic aneurysm (AAA) screening. You may need this if you are a current or former smoker. Osteoporosis. You may be screened starting at age 28 if you are at high risk. Talk with your health care provider about your test results, treatment options, and if necessary, the need for more tests. Vaccines  Your health care provider may recommend certain vaccines, such as: Influenza vaccine. This is recommended every year. Tetanus, diphtheria, and acellular pertussis (Tdap, Td) vaccine. You may need  a Td booster every 10 years. Zoster vaccine. You may need this after age 30. Pneumococcal 13-valent  conjugate (PCV13) vaccine. One dose is recommended after age 15. Pneumococcal polysaccharide (PPSV23) vaccine. One dose is recommended after age 20. Talk to your health care provider about which screenings and vaccines you need and how often you need them. This information is not intended to replace advice given to you by your health care provider. Make sure you discuss any questions you have with your health care provider. Document Released: 05/04/2015 Document Revised: 12/26/2015 Document Reviewed: 02/06/2015 Elsevier Interactive Patient Education  2017 [place] Prevention in the Home Falls can cause injuries. They can happen to people of all ages. There are many things you can do to make your home safe and to help prevent falls. What can I do on the outside of my home? Regularly fix the edges of walkways and driveways and fix any cracks. Remove anything that might make you trip as you walk through a door, such as a raised step or threshold. Trim any bushes or trees on the path to your home. Use bright outdoor lighting. Clear any walking paths of anything that might make someone trip, such as rocks or tools. Regularly check to see if handrails are loose or broken. Make sure that both sides of any steps have handrails. Any raised decks and porches should have guardrails on the edges. Have any leaves, snow, or ice cleared regularly. Use sand or salt on walking paths during winter. Clean up any spills in your garage right away. This includes oil or grease spills. What can I do in the bathroom? Use night lights. Install grab bars by the toilet and in the tub and shower. Do not use towel bars as grab bars. Use non-skid mats or decals in the tub or shower. If you need to sit down in the shower, use a plastic, non-slip stool. Keep the floor dry. Clean up any water that spills on the floor as soon as it happens. Remove soap buildup in the tub or shower regularly. Attach bath mats  securely with double-sided non-slip rug tape. Do not have throw rugs and other things on the floor that can make you trip. What can I do in the bedroom? Use night lights. Make sure that you have a light by your bed that is easy to reach. Do not use any sheets or blankets that are too big for your bed. They should not hang down onto the floor. Have a firm chair that has side arms. You can use this for support while you get dressed. Do not have throw rugs and other things on the floor that can make you trip. What can I do in the kitchen? Clean up any spills right away. Avoid walking on wet floors. Keep items that you use a lot in easy-to-reach places. If you need to reach something above you, use a strong step stool that has a grab bar. Keep electrical cords out of the way. Do not use floor polish or wax that makes floors slippery. If you must use wax, use non-skid floor wax. Do not have throw rugs and other things on the floor that can make you trip. What can I do with my stairs? Do not leave any items on the stairs. Make sure that there are handrails on both sides of the stairs and use them. Fix handrails that are broken or loose. Make sure that handrails are as long as the stairways. Check  any carpeting to make sure that it is firmly attached to the stairs. Fix any carpet that is loose or worn. Avoid having throw rugs at the top or bottom of the stairs. If you do have throw rugs, attach them to the floor with carpet tape. Make sure that you have a light switch at the top of the stairs and the bottom of the stairs. If you do not have them, ask someone to add them for you. What else can I do to help prevent falls? Wear shoes that: Do not have high heels. Have rubber bottoms. Are comfortable and fit you well. Are closed at the toe. Do not wear sandals. If you use a stepladder: Make sure that it is fully opened. Do not climb a closed stepladder. Make sure that both sides of the stepladder  are locked into place. Ask someone to hold it for you, if possible. Clearly mark and make sure that you can see: Any grab bars or handrails. First and last steps. Where the edge of each step is. Use tools that help you move around (mobility aids) if they are needed. These include: Canes. Walkers. Scooters. Crutches. Turn on the lights when you go into a dark area. Replace any light bulbs as soon as they burn out. Set up your furniture so you have a clear path. Avoid moving your furniture around. If any of your floors are uneven, fix them. If there are any pets around you, be aware of where they are. Review your medicines with your doctor. Some medicines can make you feel dizzy. This can increase your chance of falling. Ask your doctor what other things that you can do to help prevent falls. This information is not intended to replace advice given to you by your health care provider. Make sure you discuss any questions you have with your health care provider. Document Released: 02/01/2009 Document Revised: 09/13/2015 Document Reviewed: 05/12/2014 Elsevier Interactive Patient Education  2017 [employer].

## 2021-11-20 NOTE — Progress Notes (Signed)
Virtual Visit via Telephone Note  I connected with  Jason Hansen. on 11/20/21 at  9:45 AM EDT by telephone and verified that I am speaking with the correct person using two identifiers.  Medicare Annual Wellness visit completed telephonically due to Covid-19 pandemic.   Persons participating in this call: This Health Coach and this patient.   Location: Patient: office Provider: home   I discussed the limitations, risks, security and privacy concerns of performing an evaluation and management service by telephone and the availability of in person appointments. The patient expressed understanding and agreed to proceed.  Unable to perform video visit due to video visit attempted and failed and/or patient does not have video capability.   Some vital signs may be absent or patient reported.   Jason Brace, LPN   Subjective:   Jason Hansen. is a 71 y.o. male who presents for Medicare Annual/Subsequent preventive examination.  Review of Systems     Cardiac Risk Factors include: advanced age (>27mn, >>23women);male gender     Objective:    There were no vitals filed for this visit. There is no height or weight on file to calculate BMI.     11/20/2021    9:50 AM 08/16/2021    9:50 AM 07/15/2021   10:33 AM 05/23/2021   10:46 AM 04/02/2021    8:07 AM 11/14/2020    1:33 PM 02/08/2018    7:36 AM  Advanced Directives  Does Patient Have a Medical Advance Directive? Yes Yes No Yes Yes Yes Yes  Type of [place] Living will;Healthcare Power of Attorney  Living will;Healthcare Power of [place] will [place]ione[place]Living will [place] will  Does patient want to make changes to medical advance directive?    No - Patient declined   No - Patient declined  Copy of [place] Chart? No - copy requested     No - copy requested No -  copy requested    Current Medications (verified) Outpatient Encounter Medications as of 11/20/2021  Medication Sig   atorvastatin (LIPITOR) 40 MG tablet TAKE ONE TABLET ONCE DAILY   Coenzyme Q10 (COQ10) 100 MG CAPS Take 200 mg by mouth daily.   docusate sodium (COLACE) 100 MG capsule Take 1 capsule (100 mg total) by mouth 2 (two) times daily.   scopolamine (TRANSDERM-SCOP) 1 MG/3DAYS Place 1 patch (1.5 mg total) onto the skin every 3 (three) days.   tamsulosin (FLOMAX) 0.4 MG CAPS capsule Take 1 capsule (0.4 mg total) by mouth at bedtime.   traMADol (ULTRAM) 50 MG tablet Take 1-2 tablets (50-100 mg total) by mouth every 6 (six) hours as needed (pain).   triamcinolone (NASACORT) 55 MCG/ACT AERO nasal inhaler Place 2 sprays into the nose daily.   augmented betamethasone dipropionate (DIPROLENE-AF) 0.05 % cream Apply topically 2 (two) times daily as needed. (Patient not taking: Reported on 11/20/2021)   No facility-administered encounter medications on file as of 11/20/2021.    Allergies (verified) Patient has no known allergies.   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 PJorene Guest->CT good 03/2019.   Basal cell carcinoma nose   removed approx year 2000   Carpal tunnel syndrome on both  sides    wrist splints no help   Elevated blood pressure reading without diagnosis of hypertension    blood pressure has been slightly elevated at times - but lost weight - and has never required b/p meds   Elevated PSA    History of adenomatous polyp of colon 05/24/2018   Multiple colonoscopies.  Most recent 05/24/18-adenoma x 1, recall 5 yrs ([facility])   History of kidney stones    Analysis 2019->Ca++ oxalate.   History of thyroid nodule 2014   nodule found on chest CT 2014.  Resected by Dr. Remer Macho lobectomy->benign    Hypercholesterolemia    IFG (impaired fasting glucose)    115 03/2018.  A1c 5.6% and fasting gluc 112 Feb 2021. A1c 5.6% Nov 2021.   Prostate cancer ([place]) 01/2021   Elev PSA->Bx + Prost ca 02/14/21-->plan for rad prostatectomy and rad onc consultation. Rad seed implants 2023.   Wears glasses for reading    Past Surgical History:  Procedure Laterality Date   ABI's  02/2019   Bilat: NORMAL    basal cell carcinoma area removed     more than 20 yrs ago   COLONOSCOPY     multiple   CYSTOSCOPY WITH STENT PLACEMENT Left 01/31/2018   Procedure: CYSTOSCOPY WITH RIGHT URETERAL STENT PLACEMENT;  Surgeon: Raynelle Bring, MD;  Location: [facility];  Service: Urology;  Laterality: Left;   CYSTOSCOPY/URETEROSCOPY/HOLMIUM LASER/STENT PLACEMENT Left 02/08/2018   For extraction of L ureteral stone, L ureteroscopic laser lithotripsy, + ureteral stent placement. Procedure: CYSTOSCOPY/URETEROSCOPY/HOLMIUM LASER/STENT PLACEMENT;  Surgeon: Raynelle Bring, MD;  Location: [facility];  Service: Urology;  Laterality: Left;  1 HR   LITHOTRIPSY     pt states that this was done a long time ago   [place] N/A 07/15/2021   Procedure: RADIOACTIVE SEED IMPLANT/BRACHYTHERAPY IMPLANT WITH CYSTOSCOPY;  Surgeon: Raynelle Bring, MD;  Location: [facility];  Service: Urology;  Laterality: N/A;   SPACE OAR INSTILLATION N/A 07/15/2021   Procedure: SPACE OAR INSTILLATION;  Surgeon: Raynelle Bring, MD;  [facility]: [facility];  Service: Urology;  Laterality: N/A;   THYROID LOBECTOMY Left 08/12/2013   Procedure: THYROID LOBECTOMY;  Surgeon: Earnstine Regal, MD;  Location: [facility];  Service: General;  Laterality: Left;   Family History  Problem Relation Age of Onset   Breast cancer Mother    Cancer Mother    Colon cancer Father    Breast cancer Sister    Social History   Socioeconomic History   Marital status: Married    Spouse name: Not on file   Number of children: Not on file   Years of  education: Not on file   Highest education level: Not on file  Occupational History   Not on file  Tobacco Use   Smoking status: Former    Packs/day: 1.00    Years: 10.00    Total pack years: 10.00    Types: Cigarettes   Smokeless tobacco: Former    Types: Nurse, children's Use: Never used  Substance and Sexual Activity   Alcohol use: Yes    Comment: quit smoking 45 yrs ago  alcohol occ beer   Drug use: No   Sexual activity: Not on file  Other Topics Concern   Not on file  Social History Narrative   Married, 2 sons grown.  4 GC.   Orig from [place].   Occup: retired from [employer] at the [employer] [place]   No Tob.   Occ alcohol.   Social Determinants of Health   Financial Resource Strain: Low Risk  (11/20/2021)   Overall Financial Resource Strain (CARDIA)    Difficulty of Paying Living Expenses: Not hard at all  Food Insecurity: No Food Insecurity (11/20/2021)   Hunger Vital Sign    Worried About Running Out of Food in the Last Year: Never true    Ran Out of Food in the Last Year: Never true  Transportation Needs: No Transportation Needs (11/20/2021)   PRAPARE - Hydrologist (Medical): No    Lack of Transportation (Non-Medical): No  Physical Activity: Sufficiently Active (11/20/2021)   Exercise Vital Sign    Days of Exercise per Week: 4 days    Minutes of Exercise per Session: 150+ min  Stress: No Stress Concern Present (11/20/2021)   [place]    Feeling of Stress : Not at all  Social Connections: Moderately Integrated (11/20/2021)   Social Connection and Isolation Panel [NHANES]    Frequency of Communication with Friends and Family: More than three times a week    Frequency of Social Gatherings with Friends and Family: More than three times a week    Attends Religious Services: More than 4 times per year    Active Member of [employer] or Organizations: No     Attends Music therapist: Never    Marital Status: Married    Tobacco Counseling Counseling given: Not Answered   Clinical Intake:  Pre-visit preparation completed: Yes  Pain : No/denies pain     BMI - recorded: 28.3 Nutritional Status: BMI 25 -29 Overweight Nutritional Risks: None Diabetes: No  How often do you need to have someone help you when you read instructions, pamphlets, or other written materials from your doctor or pharmacy?: 1 - Never  Diabetic?no  Interpreter Needed?: No  Information entered by :: Charlott Rakes, LPN   Activities of Daily Living    11/20/2021    9:51 AM 07/15/2021   10:42 AM  In your present state of health, do you have any difficulty performing the following activities:  Hearing? 0 0  Vision? 0 0  Difficulty concentrating or making decisions? 0 0  Walking or climbing stairs? 0 0  Dressing or bathing? 0 0  Doing errands, shopping? 0   Preparing Food and eating ? N   Using the Toilet? N   In the past six months, have you accidently leaked urine? N   Do you have problems with loss of bowel control? N   Managing your Medications? N   Managing your Finances? N   Housekeeping or managing your Housekeeping? N     Patient Care Team: Tammi Sou, MD as PCP - General (Family Medicine) Armandina Gemma, MD as Consulting Physician (General Surgery) Raynelle Bring, MD as Consulting Physician (Urology) Pearlie Oyster Estill Bamberg, MD as Consulting Physician (Pulmonary Disease) Katheren Puller, RN as Oncology Nurse Navigator  Indicate any recent Medical Services you may have received from other than [facility] providers in the past year (date may be approximate).     Assessment:   This is a routine wellness examination for [facility].  Hearing/Vision screen Hearing Screening - Comments:: Pt denies any hearing issues  Vision Screening - Comments:: Pt follows up [facility] in [place] for annual eye exams   Dietary issues and exercise activities  discussed: Current Exercise Habits: Home exercise routine, Type of  exercise: Other - see comments;walking (pickle ball), Time (Minutes): > 60, Frequency (Times/Week): 4, Weekly Exercise (Minutes/Week): 0   Goals Addressed             This Visit's Progress    Patient Stated       Continue being active        Depression Screen    11/20/2021    9:49 AM 11/14/2020    1:31 PM 09/11/2020    8:22 AM 05/26/2019    8:02 AM  PHQ 2/9 Scores  PHQ - 2 Score 0 0 0 0    Fall Risk    11/20/2021    9:51 AM 11/14/2020    1:32 PM 09/11/2020    8:23 AM  [place] in the past year? 0 0 0  Number falls in past yr: 0 0 0  Injury with Fall? 0 0 0  Risk for fall due to : Impaired vision    Follow up Falls prevention discussed Falls evaluation completed;Falls prevention discussed     FALL RISK PREVENTION PERTAINING TO THE HOME:  Any stairs in or around the home? Yes  If so, are there any without handrails? No  Home free of loose throw rugs in walkways, pet beds, electrical cords, etc? Yes  Adequate lighting in your home to reduce risk of falls? Yes   ASSISTIVE DEVICES UTILIZED TO PREVENT FALLS:  Life alert? No  Use of a cane, walker or w/c? No  Grab bars in the bathroom? Yes  Shower chair or bench in shower? No  Elevated toilet seat or a handicapped toilet? No   TIMED UP AND GO:  Was the test performed? No .   Cognitive Function:        11/20/2021    9:52 AM  6CIT Screen  What Year? 0 points  What month? 0 points  What time? 0 points  Count back from 20 0 points  Months in reverse 4 points  Repeat phrase 6 points  Total Score 10 points    Immunizations Immunization History  Administered Date(s) Administered   Fluad Quad(high Dose 65+) 02/12/2017, 03/02/2019, 03/14/2020   Influenza, High Dose Seasonal PF 02/12/2017   Moderna Sars-Covid-2 Vaccination 06/20/2019, 08/20/2019   Pneumococcal Conjugate-13 02/12/2017   Pneumococcal Polysaccharide-23 03/02/2019   Td  04/22/2007   Tdap 11/22/2014    TDAP status: Up to date  Flu Vaccine status: Due, Education has been provided regarding the importance of this vaccine. Advised may receive this vaccine at local pharmacy or Health Dept. Aware to provide a copy of the vaccination record if obtained from local pharmacy or Health Dept. Verbalized acceptance and understanding.  Pneumococcal vaccine status: Up to date  Covid-19 vaccine status: Completed vaccines  Qualifies for Shingles Vaccine? Yes   Zostavax completed No   Shingrix Completed?: No.    Education has been provided regarding the importance of this vaccine. Patient has been advised to call insurance company to determine out of pocket expense if they have not yet received this vaccine. Advised may also receive vaccine at local pharmacy or Health Dept. Verbalized acceptance and understanding.  Screening Tests Health Maintenance  Topic Date Due   Zoster Vaccines- Shingrix (1 of 2) Never done   COVID-19 Vaccine (3 - Moderna risk series) 09/17/2019   INFLUENZA VACCINE  11/19/2021   TETANUS/TDAP  11/21/2024   COLONOSCOPY (Pts 45-75yr Insurance coverage will need to be confirmed)  05/24/2028   Pneumonia Vaccine 70 Years old  Completed  HPV VACCINES  Aged Out   Hepatitis C Screening  Discontinued    Health Maintenance  Health Maintenance Due  Topic Date Due   Zoster Vaccines- Shingrix (1 of 2) Never done   COVID-19 Vaccine (3 - Moderna risk series) 09/17/2019   INFLUENZA VACCINE  11/19/2021    Colorectal cancer screening: Type of screening: Colonoscopy. Completed 05/24/18. Repeat every 10 years   Additional Screening:  Hepatitis C Screening: does qualify  Vision Screening: Recommended annual ophthalmology exams for early detection of glaucoma and other disorders of the eye. Is the patient up to date with their annual eye exam?  Yes  Who is the provider or what is the name of the office in which the patient attends annual eye exams?  [facility]  If pt is not established with a provider, would they like to be referred to a provider to establish care? No .   Dental Screening: Recommended annual dental exams for proper oral hygiene  Community Resource Referral / Chronic Care Management: CRR required this visit?  No   CCM required this visit?  No      Plan:     I have personally reviewed and noted the following in the patient's chart:   Medical and social history Use of alcohol, tobacco or illicit drugs  Current medications and supplements including opioid prescriptions. Patient is currently taking opioid prescriptions. Information provided to patient regarding non-opioid alternatives. Patient advised to discuss non-opioid treatment plan with their provider. Functional ability and status Nutritional status Physical activity Advanced directives List of other physicians Hospitalizations, surgeries, and ER visits in previous 12 months Vitals Screenings to include cognitive, depression, and falls Referrals and appointments  In addition, I have reviewed and discussed with patient certain preventive protocols, quality metrics, and best practice recommendations. A written personalized care plan for preventive services as well as general preventive health recommendations were provided to patient.     Jason Brace, LPN   06/27/1015   Nurse Notes: None

## 2022-01-02 ENCOUNTER — Encounter: Payer: Self-pay | Admitting: Family Medicine

## 2022-01-02 ENCOUNTER — Ambulatory Visit (INDEPENDENT_AMBULATORY_CARE_PROVIDER_SITE_OTHER): Payer: Medicare Other | Admitting: Family Medicine

## 2022-01-02 VITALS — BP 147/69 | HR 68 | Temp 97.9°F | Ht 65.75 in | Wt 170.6 lb

## 2022-01-02 DIAGNOSIS — M545 Low back pain, unspecified: Secondary | ICD-10-CM | POA: Diagnosis not present

## 2022-01-02 DIAGNOSIS — Z23 Encounter for immunization: Secondary | ICD-10-CM

## 2022-01-02 DIAGNOSIS — R030 Elevated blood-pressure reading, without diagnosis of hypertension: Secondary | ICD-10-CM | POA: Diagnosis not present

## 2022-01-02 DIAGNOSIS — G8929 Other chronic pain: Secondary | ICD-10-CM | POA: Diagnosis not present

## 2022-01-02 DIAGNOSIS — C61 Malignant neoplasm of prostate: Secondary | ICD-10-CM | POA: Diagnosis not present

## 2022-01-02 DIAGNOSIS — Z0001 Encounter for general adult medical examination with abnormal findings: Secondary | ICD-10-CM | POA: Diagnosis not present

## 2022-01-02 DIAGNOSIS — Z9009 Acquired absence of other part of head and neck: Secondary | ICD-10-CM

## 2022-01-02 DIAGNOSIS — E7800 Pure hypercholesterolemia, unspecified: Secondary | ICD-10-CM | POA: Diagnosis not present

## 2022-01-02 DIAGNOSIS — M25562 Pain in left knee: Secondary | ICD-10-CM

## 2022-01-02 DIAGNOSIS — R7301 Impaired fasting glucose: Secondary | ICD-10-CM | POA: Diagnosis not present

## 2022-01-02 DIAGNOSIS — Z77090 Contact with and (suspected) exposure to asbestos: Secondary | ICD-10-CM

## 2022-01-02 DIAGNOSIS — Z8639 Personal history of other endocrine, nutritional and metabolic disease: Secondary | ICD-10-CM | POA: Diagnosis not present

## 2022-01-02 DIAGNOSIS — Z125 Encounter for screening for malignant neoplasm of prostate: Secondary | ICD-10-CM

## 2022-01-02 DIAGNOSIS — Z0000 Encounter for general adult medical examination without abnormal findings: Secondary | ICD-10-CM

## 2022-01-02 LAB — COMPREHENSIVE METABOLIC PANEL
ALT: 19 U/L (ref 0–53)
AST: 22 U/L (ref 0–37)
Albumin: 4.3 g/dL (ref 3.5–5.2)
Alkaline Phosphatase: 46 U/L (ref 39–117)
BUN: 16 mg/dL (ref 6–23)
CO2: 25 mEq/L (ref 19–32)
Calcium: 9.4 mg/dL (ref 8.4–10.5)
Chloride: 106 mEq/L (ref 96–112)
Creatinine, Ser: 0.98 mg/dL (ref 0.40–1.50)
GFR: 78.28 mL/min (ref >60.00)
Glucose, Bld: 104 mg/dL — ABNORMAL HIGH (ref 70–99)
Potassium: 4.3 mEq/L (ref 3.5–5.1)
Sodium: 141 mEq/L (ref 135–145)
Total Bilirubin: 0.9 mg/dL (ref 0.2–1.2)
Total Protein: 7 g/dL (ref 6.0–8.3)

## 2022-01-02 LAB — LIPID PANEL
Cholesterol: 134 mg/dL (ref 0–200)
HDL: 57.1 mg/dL (ref >39.00)
LDL Cholesterol: 66 mg/dL (ref 0–99)
NonHDL: 77.05
Total CHOL/HDL Ratio: 2
Triglycerides: 53 mg/dL (ref 0.0–149.0)
VLDL: 10.6 mg/dL (ref 0.0–40.0)

## 2022-01-02 LAB — CBC
HCT: 48.1 % (ref 39.0–52.0)
Hemoglobin: 16.2 g/dL (ref 13.0–17.0)
MCHC: 33.7 g/dL (ref 30.0–36.0)
MCV: 88.6 fl (ref 78.0–100.0)
Platelets: 222 10*3/uL (ref 150.0–400.0)
RBC: 5.43 Mil/uL (ref 4.22–5.81)
RDW: 14.7 % (ref 11.5–15.5)
WBC: 5.6 10*3/uL (ref 4.0–10.5)

## 2022-01-02 LAB — TSH: TSH: 1.86 u[IU]/mL (ref 0.35–5.50)

## 2022-01-02 LAB — PSA, MEDICARE: PSA: 0.45 ng/ml (ref 0.10–4.00)

## 2022-01-02 LAB — HEMOGLOBIN A1C: Hgb A1c MFr Bld: 5.9 % (ref 4.6–6.5)

## 2022-01-02 MED ORDER — ATORVASTATIN CALCIUM 40 MG PO TABS: 40.0000 mg | ORAL_TABLET | Freq: Every day | ORAL | 3 refills | Status: DC

## 2022-01-02 MED ORDER — ZOSTER VAC RECOMB ADJUVANTED 50 MCG/0.5ML IM SUSR: 0.5000 mL | Freq: Once | INTRAMUSCULAR | 0 refills | Status: AC

## 2022-01-02 MED ORDER — MELOXICAM 7.5 MG PO TABS: ORAL_TABLET | ORAL | 2 refills | Status: DC

## 2022-01-02 NOTE — Patient Instructions (Signed)
Health Maintenance, Male Adopting a healthy lifestyle and getting preventive care are important in promoting health and wellness. Ask your health care provider about: The right schedule for you to have regular tests and exams. Things you can do on your own to prevent diseases and keep yourself healthy. What should I know about diet, weight, and exercise? Eat a healthy diet  Eat a diet that includes plenty of vegetables, fruits, low-fat dairy products, and lean protein. Do not eat a lot of foods that are high in solid fats, added sugars, or sodium. Maintain a healthy weight Body mass index (BMI) is a measurement that can be used to identify possible weight problems. It estimates body fat based on height and weight. Your health care provider can help determine your BMI and help you achieve or maintain a healthy weight. Get regular exercise Get regular exercise. This is one of the most important things you can do for your health. Most adults should: Exercise for at least 150 minutes each week. The exercise should increase your heart rate and make you sweat (moderate-intensity exercise). Do strengthening exercises at least twice a week. This is in addition to the moderate-intensity exercise. Spend less time sitting. Even light physical activity can be beneficial. Watch cholesterol and blood lipids Have your blood tested for lipids and cholesterol at 70 years of age, then have this test every 5 years. You may need to have your cholesterol levels checked more often if: Your lipid or cholesterol levels are high. You are older than 70 years of age. You are at high risk for heart disease. What should I know about cancer screening? Many types of cancers can be detected early and may often be prevented. Depending on your health history and family history, you may need to have cancer screening at various ages. This may include screening for: Colorectal cancer. Prostate cancer. Skin cancer. Lung  cancer. What should I know about heart disease, diabetes, and high blood pressure? Blood pressure and heart disease High blood pressure causes heart disease and increases the risk of stroke. This is more likely to develop in people who have high blood pressure readings or are overweight. Talk with your health care provider about your target blood pressure readings. Have your blood pressure checked: Every 3-5 years if you are 18-39 years of age. Every year if you are 40 years old or older. If you are between the ages of 65 and 75 and are a current or former smoker, ask your health care provider if you should have a one-time screening for abdominal aortic aneurysm (AAA). Diabetes Have regular diabetes screenings. This checks your fasting blood sugar level. Have the screening done: Once every three years after age 45 if you are at a normal weight and have a low risk for diabetes. More often and at a younger age if you are overweight or have a high risk for diabetes. What should I know about preventing infection? Hepatitis B If you have a higher risk for hepatitis B, you should be screened for this virus. Talk with your health care provider to find out if you are at risk for hepatitis B infection. Hepatitis C Blood testing is recommended for: Everyone born from 1945 through 1965. Anyone with known risk factors for hepatitis C. Sexually transmitted infections (STIs) You should be screened each year for STIs, including gonorrhea and chlamydia, if: You are sexually active and are younger than 70 years of age. You are older than 70 years of age and your   health care provider tells you that you are at risk for this type of infection. Your sexual activity has changed since you were last screened, and you are at increased risk for chlamydia or gonorrhea. Ask your health care provider if you are at risk. Ask your health care provider about whether you are at high risk for HIV. Your health care provider  may recommend a prescription medicine to help prevent HIV infection. If you choose to take medicine to prevent HIV, you should first get tested for HIV. You should then be tested every 3 months for as long as you are taking the medicine. Follow these instructions at home: Alcohol use Do not drink alcohol if your health care provider tells you not to drink. If you drink alcohol: Limit how much you have to 0-2 drinks a day. Know how much alcohol is in your drink. In the [place] one drink equals one 12 oz bottle of beer (355 mL), one 5 oz glass of wine (148 mL), or one 1 oz glass of hard liquor (44 mL). Lifestyle Do not use any products that contain nicotine or tobacco. These products include cigarettes, chewing tobacco, and vaping devices, such as e-cigarettes. If you need help quitting, ask your health care provider. Do not use street drugs. Do not share needles. Ask your health care provider for help if you need support or information about quitting drugs. General instructions Schedule regular health, dental, and eye exams. Stay current with your vaccines. Tell your health care provider if: You often feel depressed. You have ever been abused or do not feel safe at home. Summary Adopting a healthy lifestyle and getting preventive care are important in promoting health and wellness. Follow your health care provider's instructions about healthy diet, exercising, and getting tested or screened for diseases. Follow your health care provider's instructions on monitoring your cholesterol and blood pressure. This information is not intended to replace advice given to you by your health care provider. Make sure you discuss any questions you have with your health care provider. Document Revised: 08/27/2020 Document Reviewed: 08/27/2020 Elsevier Patient Education  2023 [employer]  

## 2022-01-02 NOTE — Progress Notes (Signed)
Office Note 01/02/2022  CC:  Chief Complaint  Patient presents with   Annual Exam    Pt is fasting    HPI:  Patient is a 71 y.o. male who is here for annual health maintenance exam and follow-up history of elevated blood pressures without diagnosis of hypertension and hyperlipidemia.  Blood pressures at home are consistently normal.  Prostate Ca-->Seeds 07/2021.  He plays pickle ball a lot.  Having some recent pain in the anterior aspect of the left knee, points to the inferior pole of the patella.  No swelling or redness.  No popping or giving way. Has been going on for 7 to 10 days now.  Essentially only hurts when up and around and active.  Has chronic low back pain that waxes and wanes, has been diagnosed with lumbar spondylosis in the past. No history of radiculopathy, paresthesias, or low back surgery.  He recently tried one of his wife's meloxicam 7.5 mg tabs and it helped significantly with his pain.  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 Jorene Guest.->CT good 03/2019.   Basal cell carcinoma nose   removed approx year 2000   Carpal tunnel syndrome on both sides    wrist splints no help   Elevated blood pressure reading without diagnosis of hypertension    blood pressure has been slightly elevated at times - but lost weight - and has never required b/p meds   Elevated PSA    History of adenomatous polyp of colon 05/24/2018   Multiple colonoscopies.  Most recent 05/24/18-adenoma x 1, recall 5 yrs ([facility])   History of kidney stones    Analysis 2019->Ca++ oxalate.   History of thyroid nodule 2014   nodule found on chest CT 2014.  Resected by Dr. Remer Macho lobectomy->benign   Hypercholesterolemia    IFG (impaired fasting glucose)    115 03/2018.  A1c 5.6% and fasting gluc 112 Feb 2021. A1c 5.6%  Nov 2021.   Prostate cancer ([place]) 01/2021   Elev PSA->Bx + Prost ca 02/14/21-->Rad seed implants 07/2021.    Past Surgical History:  Procedure Laterality Date   ABI's  02/2019   Bilat: NORMAL    basal cell carcinoma area removed     more than 20 yrs ago   COLONOSCOPY     multiple   CYSTOSCOPY WITH STENT PLACEMENT Left 01/31/2018   Procedure: CYSTOSCOPY WITH RIGHT URETERAL STENT PLACEMENT;  Surgeon: Raynelle Bring, MD;  Location: [facility];  Service: Urology;  Laterality: Left;   CYSTOSCOPY/URETEROSCOPY/HOLMIUM LASER/STENT PLACEMENT Left 02/08/2018   For extraction of L ureteral stone, L ureteroscopic laser lithotripsy, + ureteral stent placement. Procedure: CYSTOSCOPY/URETEROSCOPY/HOLMIUM LASER/STENT PLACEMENT;  Surgeon: Raynelle Bring, MD;  Location: [facility];  Service: Urology;  Laterality: Left;  1 HR   LITHOTRIPSY     pt states that this was done a long time ago   [place] N/A 07/15/2021   Procedure: RADIOACTIVE SEED IMPLANT/BRACHYTHERAPY IMPLANT WITH CYSTOSCOPY;  Surgeon: Raynelle Bring, MD;  Location: [facility];  Service: Urology;  Laterality: N/A;   SPACE OAR INSTILLATION N/A 07/15/2021   Procedure: SPACE OAR INSTILLATION;  Surgeon: Raynelle Bring, MD;  Location: [facility];  Service: Urology;  Laterality: N/A;   THYROID LOBECTOMY Left 08/12/2013   Procedure: THYROID LOBECTOMY;  Surgeon: Sherren Mocha  Leeanne Mannan, MD;  Location: [facility];  Service: General;  Laterality: Left;    Family History  Problem Relation Age of Onset   Breast cancer Mother    Cancer Mother    Colon cancer Father    Breast cancer Sister     Social History   Socioeconomic History   Marital status: Married    Spouse name: Not on file   Number of children: Not on file   Years of education: Not on file   Highest education level: Not on file  Occupational History   Not on file  Tobacco Use   Smoking status: Former    Packs/day: 1.00    Years: 10.00    Total pack  years: 10.00    Types: Cigarettes   Smokeless tobacco: Former    Types: Nurse, children's Use: Never used  Substance and Sexual Activity   Alcohol use: Yes    Comment: quit smoking 45 yrs ago  alcohol occ beer   Drug use: No   Sexual activity: Not on file  Other Topics Concern   Not on file  Social History Narrative   Married, 2 sons grown.  4 GC.   Orig from [place].   Occup: retired from [employer] at the [employer] on [employer].   No Tob.   Occ alcohol.   Social Determinants of Health   Financial Resource Strain: Low Risk  (11/20/2021)   Overall Financial Resource Strain (CARDIA)    Difficulty of Paying Living Expenses: Not hard at all  Food Insecurity: No Food Insecurity (11/20/2021)   Hunger Vital Sign    Worried About Running Out of Food in the Last Year: Never true    Ran Out of Food in the Last Year: Never true  Transportation Needs: No Transportation Needs (11/20/2021)   PRAPARE - Hydrologist (Medical): No    Lack of Transportation (Non-Medical): No  Physical Activity: Sufficiently Active (11/20/2021)   Exercise Vital Sign    Days of Exercise per Week: 4 days    Minutes of Exercise per Session: 150+ min  Stress: No Stress Concern Present (11/20/2021)   [place]    Feeling of Stress : Not at all  Social Connections: Moderately Integrated (11/20/2021)   Social Connection and Isolation Panel [NHANES]    Frequency of Communication with Friends and Family: More than three times a week    Frequency of Social Gatherings with Friends and Family: More than three times a week    Attends Religious Services: More than 4 times per year    Active Member of [employer] or Organizations: No    Attends Archivist Meetings: Never    Marital Status: Married  Human resources officer Violence: Not At Risk (11/20/2021)   Humiliation, Afraid, Rape, and Kick questionnaire    Fear of Current  or Ex-Partner: No    Emotionally Abused: No    Physically Abused: No    Sexually Abused: No    Outpatient Medications Prior to Visit  Medication Sig Dispense Refill   augmented betamethasone dipropionate (DIPROLENE-AF) 0.05 % cream Apply topically 2 (two) times daily as needed.     Coenzyme Q10 (COQ10) 100 MG CAPS Take 200 mg by mouth daily.     docusate sodium (COLACE) 100 MG capsule Take 1 capsule (100 mg total) by mouth 2 (two) times daily. 30 capsule 0   scopolamine (TRANSDERM-SCOP) 1  MG/3DAYS Place 1 patch (1.5 mg total) onto the skin every 3 (three) days. 4 patch 1   tamsulosin (FLOMAX) 0.4 MG CAPS capsule Take 1 capsule (0.4 mg total) by mouth at bedtime. 30 capsule 0   traMADol (ULTRAM) 50 MG tablet Take 1-2 tablets (50-100 mg total) by mouth every 6 (six) hours as needed (pain). 15 tablet 0   triamcinolone (NASACORT) 55 MCG/ACT AERO nasal inhaler Place 2 sprays into the nose daily.     atorvastatin (LIPITOR) 40 MG tablet TAKE ONE TABLET ONCE DAILY 90 tablet 0   No facility-administered medications prior to visit.    No Known Allergies  ROS Review of Systems  Constitutional:  Negative for appetite change, chills, fatigue and fever.  HENT:  Negative for congestion, dental problem, ear pain and sore throat.   Eyes:  Negative for discharge, redness and visual disturbance.  Respiratory:  Negative for cough, chest tightness, shortness of breath and wheezing.   Cardiovascular:  Negative for chest pain, palpitations and leg swelling.  Gastrointestinal:  Negative for abdominal pain, blood in stool, diarrhea, nausea and vomiting.  Genitourinary:  Negative for difficulty urinating, dysuria, flank pain, frequency, hematuria and urgency.  Musculoskeletal:  Positive for arthralgias (L knee) and back pain. Negative for joint swelling, myalgias and neck stiffness.  Skin:  Negative for pallor and rash.  Neurological:  Negative for dizziness, speech difficulty, weakness and headaches.   Hematological:  Negative for adenopathy. Does not bruise/bleed easily.  Psychiatric/Behavioral:  Negative for confusion and sleep disturbance. The patient is not nervous/anxious.     PE;    01/02/2022    8:17 AM 08/16/2021    9:46 AM 07/15/2021    2:17 PM  Vitals with BMI  Height 5' 5.75" '5\' 5"'$    Weight 170 lbs 10 oz 172 lbs   BMI 78.29 56.21   Systolic 308 657 846  Diastolic 69 91 78  Pulse 68 70 54  Rpt manual bp today 130/70  Gen: Alert, well appearing.  Patient is oriented to person, place, time, and situation. AFFECT: pleasant, lucid thought and speech. ENT: Ears: EACs clear, normal epithelium.  TMs with good light reflex and landmarks bilaterally.  Eyes: no injection, icteris, swelling, or exudate.  EOMI, PERRLA. Nose: no drainage or turbinate edema/swelling.  No injection or focal lesion.  Mouth: lips without lesion/swelling.  Oral mucosa pink and moist.  Dentition intact and without obvious caries or gingival swelling.  Oropharynx without erythema, exudate, or swelling.  Neck: supple/nontender.  No LAD, mass, or TM.  Carotid pulses 2+ bilaterally, without bruits. CV: RRR, no m/r/g.   LUNGS: CTA bilat, nonlabored resps, good aeration in all lung fields. ABD: soft, NT, ND, BS normal.  No hepatospenomegaly or mass.  No bruits. EXT: no clubbing, cyanosis, or edema.  Musculoskeletal: no joint swelling, erythema, warmth, or tenderness.  ROM of all joints intact. Skin - no sores or suspicious lesions or rashes or color changes  Pertinent labs:  Lab Results  Component Value Date   TSH 2.59 03/14/2020   Lab Results  Component Value Date   WBC 6.4 03/14/2020   HGB 16.3 03/14/2020   HCT 49.2 03/14/2020   MCV 89.1 03/14/2020   PLT 256.0 03/14/2020   Lab Results  Component Value Date   CREATININE 1.07 03/14/2020   BUN 22 03/14/2020   NA 140 03/14/2020   K 4.6 03/14/2020   CL 104 03/14/2020   CO2 28 03/14/2020   Lab Results  Component Value Date  ALT 19 09/11/2020    AST 21 09/11/2020   ALKPHOS 45 09/11/2020   BILITOT 1.0 09/11/2020   Lab Results  Component Value Date   CHOL 150 09/11/2020   Lab Results  Component Value Date   HDL 61.20 09/11/2020   Lab Results  Component Value Date   LDLCALC 81 09/11/2020   Lab Results  Component Value Date   TRIG 39.0 09/11/2020   Lab Results  Component Value Date   CHOLHDL 2 09/11/2020   Lab Results  Component Value Date   PSA 4.92 12/12/2020   PSA 4.35 (H) 09/11/2020   PSA 4.23 (H) 03/14/2020   Lab Results  Component Value Date   HGBA1C 5.6 03/14/2020   ASSESSMENT AND PLAN:   #1 elevated blood pressure reading without diagnosis of hypertension. Consistent with whitecoat hypertension.  2 hyperlipidemia, tolerating atorvastatin 40 mg a day well. Lipid panel and hepatic panel today.  3.  Prostate cancer, started brachytherapy about 5 months ago. Patient request PSA monitoring today.  He has urology follow-up in November.  #4 musculoskeletal pain--describes mechanical low back pain.  Discussed as needed ice, stretches, and will prescribe meloxicam 7.5 mg for him to take 1-2 daily as needed.  5 left knee pain.  Consistent with some patellar tendinitis versus patellofemoral pain. Reassured.  Knee sleeve for comfort.  Discussed cutting back on activity level a bit for couple weeks, icing after exercise.  6. Health maintenance exam: Reviewed age and gender appropriate health maintenance issues (prudent diet, regular exercise, health risks of tobacco and excessive alcohol, use of seatbelts, fire alarms in home, use of sunscreen).  Also reviewed age and gender appropriate health screening as well as vaccine recommendations. Vaccines: Flu->given today.  Shingrix->discussed, will send rx to pharmacy.  Otherwise all UTD. Labs: fasting HP, a1c. Prostate ca screening: Has prostate cancer, brachytherapy currently, followed by urology. Pt requested PSA monitoring here today so I ordered this. Colon ca  screening: hx of polyps, next colonoscopy due 05/2023.  An After Visit Summary was printed and given to the patient.  FOLLOW UP:  Return in about 6 months (around 07/03/2022) for routine chronic illness f/u.  Signed:  Crissie Sickles, MD           01/02/2022

## 2022-01-03 ENCOUNTER — Telehealth: Payer: Self-pay

## 2022-01-03 NOTE — Telephone Encounter (Signed)
Patient returning call about lab results.  Please call patient back when available.  

## 2022-01-03 NOTE — Telephone Encounter (Signed)
Spoke with patient regarding results/recommendations.  

## 2022-01-11 ENCOUNTER — Emergency Department (HOSPITAL_COMMUNITY): Payer: Medicare Other

## 2022-01-11 ENCOUNTER — Emergency Department (HOSPITAL_COMMUNITY)
Admission: EM | Admit: 2022-01-11 | Discharge: 2022-01-11 | Disposition: A | Discharge status: Home / Self Care | Payer: Medicare Other | Attending: Emergency Medicine | Admitting: Emergency Medicine

## 2022-01-11 ENCOUNTER — Encounter (HOSPITAL_COMMUNITY): Payer: Self-pay

## 2022-01-11 ENCOUNTER — Other Ambulatory Visit: Payer: Self-pay

## 2022-01-11 DIAGNOSIS — Z8546 Personal history of malignant neoplasm of prostate: Secondary | ICD-10-CM | POA: Insufficient documentation

## 2022-01-11 DIAGNOSIS — N200 Calculus of kidney: Secondary | ICD-10-CM | POA: Diagnosis not present

## 2022-01-11 DIAGNOSIS — K769 Liver disease, unspecified: Secondary | ICD-10-CM | POA: Diagnosis not present

## 2022-01-11 DIAGNOSIS — K5730 Diverticulosis of large intestine without perforation or abscess without bleeding: Secondary | ICD-10-CM | POA: Diagnosis not present

## 2022-01-11 DIAGNOSIS — M545 Low back pain, unspecified: Secondary | ICD-10-CM

## 2022-01-11 DIAGNOSIS — R1030 Lower abdominal pain, unspecified: Secondary | ICD-10-CM | POA: Diagnosis not present

## 2022-01-11 DIAGNOSIS — R112 Nausea with vomiting, unspecified: Secondary | ICD-10-CM | POA: Diagnosis not present

## 2022-01-11 LAB — URINALYSIS, ROUTINE W REFLEX MICROSCOPIC
Bacteria, UA: NONE SEEN
Bilirubin Urine: NEGATIVE
Glucose, UA: 50 mg/dL — AB
Hgb urine dipstick: NEGATIVE
Ketones, ur: 20 mg/dL — AB
Leukocytes,Ua: NEGATIVE
Nitrite: NEGATIVE
Protein, ur: 30 mg/dL — AB
Specific Gravity, Urine: 1.03 (ref 1.005–1.030)
pH: 5 (ref 5.0–8.0)

## 2022-01-11 LAB — CBC
HCT: 50.1 % (ref 39.0–52.0)
Hemoglobin: 16.7 g/dL (ref 13.0–17.0)
MCH: 29.7 pg (ref 26.0–34.0)
MCHC: 33.3 g/dL (ref 30.0–36.0)
MCV: 89.1 fL (ref 80.0–100.0)
Platelets: 315 10*3/uL (ref 150–400)
RBC: 5.62 MIL/uL (ref 4.22–5.81)
RDW: 13.7 % (ref 11.5–15.5)
WBC: 12.3 10*3/uL — ABNORMAL HIGH (ref 4.0–10.5)
nRBC: 0 % (ref 0.0–0.2)

## 2022-01-11 LAB — COMPREHENSIVE METABOLIC PANEL
ALT: 21 U/L (ref 0–44)
AST: 22 U/L (ref 15–41)
Albumin: 4.6 g/dL (ref 3.5–5.0)
Alkaline Phosphatase: 46 U/L (ref 38–126)
Anion gap: 8 (ref 5–15)
BUN: 23 mg/dL (ref 8–23)
CO2: 24 mmol/L (ref 22–32)
Calcium: 9.6 mg/dL (ref 8.9–10.3)
Chloride: 110 mmol/L (ref 98–111)
Creatinine, Ser: 1 mg/dL (ref 0.61–1.24)
GFR, Estimated: >60 mL/min (ref >60)
Glucose, Bld: 218 mg/dL — ABNORMAL HIGH (ref 70–99)
Potassium: 4.2 mmol/L (ref 3.5–5.1)
Sodium: 142 mmol/L (ref 135–145)
Total Bilirubin: 0.9 mg/dL (ref 0.3–1.2)
Total Protein: 8.3 g/dL — ABNORMAL HIGH (ref 6.5–8.1)

## 2022-01-11 MED ORDER — KETOROLAC TROMETHAMINE 15 MG/ML IJ SOLN
15.0000 mg | Freq: Once | INTRAMUSCULAR | Status: AC
  Administered 2022-01-11: 15 mg via INTRAMUSCULAR
  Filled 2022-01-11: qty 1

## 2022-01-11 MED ORDER — SODIUM CHLORIDE 0.9 % IV BOLUS
1000.0000 mL | Freq: Once | INTRAVENOUS | Status: AC
  Administered 2022-01-11: 1000 mL via INTRAVENOUS

## 2022-01-11 MED ORDER — ONDANSETRON 8 MG PO TBDP
8.0000 mg | ORAL_TABLET | Freq: Once | ORAL | Status: AC
Start: 2022-01-11 — End: 2022-01-11
  Administered 2022-01-11: 8 mg via ORAL
  Filled 2022-01-11: qty 1

## 2022-01-11 MED ORDER — ONDANSETRON HCL 4 MG PO TABS
4.0000 mg | ORAL_TABLET | Freq: Four times a day (QID) | ORAL | 0 refills | Status: DC
Start: 2022-01-11 — End: 2022-07-03

## 2022-01-11 NOTE — ED Triage Notes (Signed)
Pt reports lower back pain radiating into abd with decreased urinary output/dribbling, n/v since last night.  Hx prostate ca and kidney stones.

## 2022-01-11 NOTE — ED Provider Notes (Signed)
[facility] DEPT Provider Note   CSN: [id number] Arrival date & time: 01/11/22  0715     History  Chief Complaint  Patient presents with   Flank Pain         Jason Hansen. is a 71 y.o. male.  71 yo M with hx of prostate ca undergoing radiation and kidney stones who presents with back pain and abdominal pain  - Started in L5 area radiating to lower abdomen, was sharp, abrupt in onset - 10/10 initially but now is 2/10 after toradol in triage - Had 3 episodes of NBNB emesis which have improved after the medications we gave him - No exacerbating or alleviating factors including movement - Denies bowel or bladder retention - No dysuria, frequency, or fevers - No injuries - No radicular pain       Home Medications Prior to Admission medications   Medication Sig Start Date End Date Taking? Authorizing Provider  ondansetron (ZOFRAN) 4 MG tablet Take 1 tablet (4 mg total) by mouth every 6 (six) hours. 01/11/22  Yes Fransico Meadow, MD  atorvastatin (LIPITOR) 40 MG tablet Take 1 tablet (40 mg total) by mouth daily. 01/02/22   McGowen, Adrian Blackwater, MD  augmented betamethasone dipropionate (DIPROLENE-AF) 0.05 % cream Apply topically 2 (two) times daily as needed. 12/17/19   [provider]  Coenzyme Q10 (COQ10) 100 MG CAPS Take 200 mg by mouth daily.    [provider]  docusate sodium (COLACE) 100 MG capsule Take 1 capsule (100 mg total) by mouth 2 (two) times daily. 07/15/21   Raynelle Bring, MD  meloxicam (MOBIC) 7.5 MG tablet 1-2 tabs po qd prn pain 01/02/22   McGowen, Adrian Blackwater, MD  scopolamine (TRANSDERM-SCOP) 1 MG/3DAYS Place 1 patch (1.5 mg total) onto the skin every 3 (three) days. 09/23/21   McGowen, Adrian Blackwater, MD  tamsulosin (FLOMAX) 0.4 MG CAPS capsule Take 1 capsule (0.4 mg total) by mouth at bedtime. 07/15/21   Raynelle Bring, MD  traMADol (ULTRAM) 50 MG tablet Take 1-2 tablets (50-100 mg total) by mouth every 6 (six) hours  as needed (pain). 07/15/21   Raynelle Bring, MD  triamcinolone (NASACORT) 55 MCG/ACT AERO nasal inhaler Place 2 sprays into the nose daily.    [provider]      Allergies    Patient has no known allergies.    Review of Systems   Review of Systems  Physical Exam Updated Vital Signs BP 116/71 (BP Location: Left Arm)   Pulse 68   Temp 98.4 F (36.9 C) (Oral)   Resp 18   SpO2 98%  Physical Exam Vitals and nursing note reviewed.  Constitutional:      General: He is not in acute distress.    Appearance: He is well-developed.  HENT:     Head: Normocephalic and atraumatic.     Right Ear: External ear normal.     Left Ear: External ear normal.     Nose: Nose normal.  Eyes:     Extraocular Movements: Extraocular movements intact.     Conjunctiva/sclera: Conjunctivae normal.     Pupils: Pupils are equal, round, and reactive to light.  Cardiovascular:     Rate and Rhythm: Normal rate and regular rhythm.     Heart sounds: Normal heart sounds.  Pulmonary:     Effort: Pulmonary effort is normal. No respiratory distress.     Breath sounds: Normal breath sounds.  Abdominal:     General: There  is no distension.     Palpations: Abdomen is soft. There is no mass.     Tenderness: There is no abdominal tenderness. There is no right CVA tenderness, left CVA tenderness or guarding.  Musculoskeletal:        General: No swelling.     Cervical back: Normal range of motion and neck supple.     Right lower leg: No edema.     Left lower leg: No edema.     Comments: Sacral tenderness to palpation but no lumbar, thoracic, or cervical step-offs or tenderness to palpation. 5/5 strength in BL lower extremities and intact sensation to LT in BL lower extremities  Skin:    General: Skin is warm and dry.     Capillary Refill: Capillary refill takes less than 2 seconds.  Neurological:     Mental Status: He is alert. Mental status is at baseline.  Psychiatric:        Mood and Affect: Mood  normal.        Behavior: Behavior normal.     ED Results / Procedures / Treatments   Labs (all labs ordered are listed, but only abnormal results are displayed) Labs Reviewed  CBC - Abnormal; Notable for the following components:      Result Value   WBC 12.3 (*)    All other components within normal limits  COMPREHENSIVE METABOLIC PANEL - Abnormal; Notable for the following components:   Glucose, Bld 218 (*)    Total Protein 8.3 (*)    All other components within normal limits  URINALYSIS, ROUTINE W REFLEX MICROSCOPIC - Abnormal; Notable for the following components:   Glucose, UA 50 (*)    Ketones, ur 20 (*)    Protein, ur 30 (*)    All other components within normal limits    EKG None  Radiology No results found.  Procedures Procedures   Medications Ordered in ED Medications  ketorolac (TORADOL) 15 MG/ML injection 15 mg (15 mg Intramuscular Given 01/11/22 0816)  sodium chloride 0.9 % bolus 1,000 mL (0 mLs Intravenous Stopped 01/11/22 0930)  ondansetron (ZOFRAN-ODT) disintegrating tablet 8 mg (8 mg Oral Given 01/11/22 0815)    ED Course/ Medical Decision Making/ A&P Clinical Course as of 01/13/22 1114  Sat Jan 11, 2022  0906 CT Renal Stone Study IMPRESSION: 1. Multiple nonobstructive calculi in the collecting systems of both kidneys measuring up to 6 mm. No ureteral stones or findings of urinary tract obstruction. 2. Colonic diverticulosis without evidence of acute diverticulitis at this time. 3. Aortic atherosclerosis. 4. Additional incidental findings, as above. [RP]    Clinical Course User Index [RP] Fransico Meadow, MD                           Medical Decision Making Amount and/or Complexity of Data Reviewed Radiology:  Decision-making details documented in ED Course.  Risk Prescription drug management.   Winton Offord Jacquees Gongora. is a 71 yo M with hx of prostate ca undergoing radiation and kidney stones who presents with lower back pain and  abdominal pain  Initial Ddx:  Kidney stone, AAA, pathologic fracture, pyelonephritis, MSK pain, spinal cord compression   MDM:  Feel that patient likely has kidney stone with his hx and symptoms will evaluate with CT. Considered non-ruptured AAA which we will evaluate for with CT. With his hx of cancer possible that he could have pathologic fracture but feel less likely. Also considered pyelonephritis but  no urinary or general infectious symptoms at this time. No neuro symptoms to suggest cord compression. No significant abdominal TTP to suggest bowel pathology.   Plan:  Labs UA CT stone study  ED Summary:  Ct did not show evidence of obstructing stone or AAA to explain pt's symptoms. No pathologic fracture noted. No abnormalities on UA to suggest UTI. Pt informed of his non-obstructive stones and was instructed to fu with his PCP regarding his symptoms.   Dispo: DC Home. Return precautions discussed including, but not limited to, those listed in the AVS. Allowed pt time to ask questions which were answered fully prior to dc.   Records reviewed Care Everywhere The following labs were independently interpreted: Chemistry and Urinalysis   Final Clinical Impression(s) / ED Diagnoses Final diagnoses:  Acute midline low back pain, unspecified whether sciatica present  Lower abdominal pain    Rx / DC Orders ED Discharge Orders          Ordered    ondansetron (ZOFRAN) 4 MG tablet  Every 6 hours        01/11/22 1147              Fransico Meadow, MD 01/13/22 1115

## 2022-01-11 NOTE — Discharge Instructions (Signed)
Today you were seen in the emergency department for your back pain and abdominal pain.    In the emergency department you had a CT scan and labs that were reassuring.    At home, please take Tylenol and lidocaine patches for your pain.  You may also take several days of ibuprofen or Motrin but do not take for more than 3 days.  You may also take the Zofran we have prescribed you for any nausea you may have.  Follow-up with your primary doctor in 2-3 days regarding your visit.    Return immediately to the emergency department if you experience any of the following: Worsening pain, urinary or bowel incontinence, numbness or weakness of your legs, or any other concerning symptoms.    Thank you for visiting our Emergency Department. It was a pleasure taking care of you today.

## 2022-01-12 DIAGNOSIS — K5730 Diverticulosis of large intestine without perforation or abscess without bleeding: Secondary | ICD-10-CM | POA: Diagnosis not present

## 2022-01-12 DIAGNOSIS — Z7982 Long term (current) use of aspirin: Secondary | ICD-10-CM | POA: Diagnosis not present

## 2022-01-12 DIAGNOSIS — Z87442 Personal history of urinary calculi: Secondary | ICD-10-CM | POA: Diagnosis not present

## 2022-01-12 DIAGNOSIS — E785 Hyperlipidemia, unspecified: Secondary | ICD-10-CM | POA: Diagnosis not present

## 2022-01-12 DIAGNOSIS — K529 Noninfective gastroenteritis and colitis, unspecified: Secondary | ICD-10-CM | POA: Diagnosis not present

## 2022-01-12 DIAGNOSIS — Z79899 Other long term (current) drug therapy: Secondary | ICD-10-CM | POA: Diagnosis not present

## 2022-01-12 DIAGNOSIS — N200 Calculus of kidney: Secondary | ICD-10-CM | POA: Diagnosis not present

## 2022-01-12 DIAGNOSIS — Z20822 Contact with and (suspected) exposure to covid-19: Secondary | ICD-10-CM | POA: Diagnosis not present

## 2022-01-12 DIAGNOSIS — N281 Cyst of kidney, acquired: Secondary | ICD-10-CM | POA: Diagnosis not present

## 2022-01-12 DIAGNOSIS — Z791 Long term (current) use of non-steroidal anti-inflammatories (NSAID): Secondary | ICD-10-CM | POA: Diagnosis not present

## 2022-01-12 DIAGNOSIS — K3189 Other diseases of stomach and duodenum: Secondary | ICD-10-CM | POA: Diagnosis not present

## 2022-03-05 LAB — PSA: PSA: 0.33

## 2022-03-12 DIAGNOSIS — N200 Calculus of kidney: Secondary | ICD-10-CM | POA: Diagnosis not present

## 2022-03-12 DIAGNOSIS — R3912 Poor urinary stream: Secondary | ICD-10-CM | POA: Diagnosis not present

## 2022-04-07 ENCOUNTER — Telehealth: Payer: Self-pay | Admitting: Family Medicine

## 2022-04-07 MED ORDER — SCOPOLAMINE 1 MG/3DAYS TD PT72: 1.0000 | MEDICATED_PATCH | TRANSDERMAL | 1 refills | Status: DC

## 2022-04-07 NOTE — Telephone Encounter (Signed)
Prescription for scopolamine sent.

## 2022-04-07 NOTE — Telephone Encounter (Signed)
Pt informed

## 2022-04-07 NOTE — Telephone Encounter (Signed)
Pt last appt was CPE 01/03/22. Please advise on script.

## 2022-04-07 NOTE — Telephone Encounter (Signed)
Patient is requesting the patches for nausea before he leaves for his cruise the end of Jan. He is asking for about 12 patches. Please contact patient when these have been sent.

## 2022-07-03 ENCOUNTER — Encounter: Payer: Self-pay | Admitting: Family Medicine

## 2022-07-03 ENCOUNTER — Ambulatory Visit (INDEPENDENT_AMBULATORY_CARE_PROVIDER_SITE_OTHER): Payer: Medicare Other | Admitting: Family Medicine

## 2022-07-03 VITALS — BP 140/76 | HR 60 | Temp 98.3°F | Ht 65.75 in | Wt 171.8 lb

## 2022-07-03 DIAGNOSIS — Z23 Encounter for immunization: Secondary | ICD-10-CM

## 2022-07-03 DIAGNOSIS — J069 Acute upper respiratory infection, unspecified: Secondary | ICD-10-CM

## 2022-07-03 DIAGNOSIS — E7800 Pure hypercholesterolemia, unspecified: Secondary | ICD-10-CM

## 2022-07-03 DIAGNOSIS — R7303 Prediabetes: Secondary | ICD-10-CM | POA: Diagnosis not present

## 2022-07-03 DIAGNOSIS — R030 Elevated blood-pressure reading, without diagnosis of hypertension: Secondary | ICD-10-CM | POA: Diagnosis not present

## 2022-07-03 LAB — COMPREHENSIVE METABOLIC PANEL
ALT: 18 U/L (ref 0–53)
AST: 22 U/L (ref 0–37)
Albumin: 4.3 g/dL (ref 3.5–5.2)
Alkaline Phosphatase: 45 U/L (ref 39–117)
BUN: 15 mg/dL (ref 6–23)
CO2: 27 mEq/L (ref 19–32)
Calcium: 9.4 mg/dL (ref 8.4–10.5)
Chloride: 106 mEq/L (ref 96–112)
Creatinine, Ser: 0.99 mg/dL (ref 0.40–1.50)
GFR: 77.07 mL/min (ref >60.00)
Glucose, Bld: 115 mg/dL — ABNORMAL HIGH (ref 70–99)
Potassium: 5 mEq/L (ref 3.5–5.1)
Sodium: 140 mEq/L (ref 135–145)
Total Bilirubin: 0.9 mg/dL (ref 0.2–1.2)
Total Protein: 6.9 g/dL (ref 6.0–8.3)

## 2022-07-03 LAB — LIPID PANEL
Cholesterol: 137 mg/dL (ref 0–200)
HDL: 55.9 mg/dL (ref >39.00)
LDL Cholesterol: 72 mg/dL (ref 0–99)
NonHDL: 81.33
Total CHOL/HDL Ratio: 2
Triglycerides: 45 mg/dL (ref 0.0–149.0)
VLDL: 9 mg/dL (ref 0.0–40.0)

## 2022-07-03 LAB — POCT GLYCOSYLATED HEMOGLOBIN (HGB A1C)
HbA1c POC (<> result, manual entry): 5.4 % (ref 4.0–5.6)
HbA1c, POC (controlled diabetic range): 5.4 % (ref 0.0–7.0)
HbA1c, POC (prediabetic range): 5.4 % — AB (ref 5.7–6.4)
Hemoglobin A1C: 5.4 % (ref 4.0–5.6)

## 2022-07-03 MED ORDER — ZOSTER VAC RECOMB ADJUVANTED 50 MCG/0.5ML IM SUSR: 0.5000 mL | Freq: Once | INTRAMUSCULAR | 0 refills | Status: AC

## 2022-07-03 NOTE — Progress Notes (Signed)
OFFICE VISIT  07/03/2022  CC:  Chief Complaint  Patient presents with   Medical Management of Chronic Issues    Patient is a 72 y.o. male who presents for 35-monthfollow-up elevated blood pressure without diagnosis of hypertension, prediabetes, and f/u hyperlipidemia.  INTERIM HX: Patient had a URI about 2 weeks ago.  Significant nasal congestion/runny nose, cough, fatigue.  For the most part the symptoms have cleared up except he still feels a little bit "blah".  He has never had any fevers.  No face pain, headache, or sore throat.  Home blood pressures remain consistently 120s over 70s.  Unfortunately he had a kidney stone back in N12/13/2023  This passed on its own.  ROS as above, plus--> no fevers, no CP, no SOB, no wheezing, no dizziness, no HAs, no rashes, no melena/hematochezia.  No polyuria or polydipsia.  No myalgias or arthralgias.  No focal weakness, paresthesias, or tremors.  No acute vision or hearing abnormalities.  No dysuria or unusual/new urinary urgency or frequency.  No recent changes in lower legs. No n/v/d or abd pain.  No palpitations.     Past Medical History:  Diagnosis Date   Arthritis    both shoulders   [place]    exposed at work for [employer] no symptoms at this time (11/2018): gets annual f/u imaging and PFTs with pulm Dr. PJorene Guest->CT good 03/2019.   Basal cell carcinoma nose   removed approx year 2000   Carpal tunnel syndrome on both sides    wrist splints no help   Elevated blood pressure reading without diagnosis of hypertension    blood pressure has been slightly elevated at times - but lost weight - and has never required b/p meds   Elevated PSA    History of adenomatous polyp of colon 05/24/2018   Multiple colonoscopies.  Most recent 05/24/18-adenoma x 1, recall 5 yrs ([facility])   History of kidney stones    Analysis 2019->Ca++ oxalate.   History of thyroid nodule 2014   nodule found on chest CT 2014.  Resected by Dr.  GRemer Macholobectomy->benign   Hypercholesterolemia    IFG (impaired fasting glucose)    115 03/2018.  A1c 5.6% and fasting gluc 112 Feb 2021. A1c 5.6% N12-13-21   [place]   Elev PSA->Bx + Prost ca 02/14/21-->Rad seed implants 07/2021.    Past Surgical History:  Procedure Laterality Date   ABI's  1Dec 13, 2020  Bilat: NORMAL    basal cell carcinoma area removed     more than 20 yrs ago   COLONOSCOPY     multiple   CYSTOSCOPY WITH STENT PLACEMENT Left 01/31/2018   Procedure: CYSTOSCOPY WITH RIGHT URETERAL STENT PLACEMENT;  Surgeon: BRaynelle Bring MD;  [facility];  Service: Urology;  Laterality: Left;   CYSTOSCOPY/URETEROSCOPY/HOLMIUM LASER/STENT PLACEMENT Left 02/08/2018   For extraction of L ureteral stone, L ureteroscopic laser lithotripsy, + ureteral stent placement. Procedure: CYSTOSCOPY/URETEROSCOPY/HOLMIUM LASER/STENT PLACEMENT;  Surgeon: BRaynelle Bring MD;  [facility];  Service: Urology;  Laterality: Left;  1 HR   LITHOTRIPSY     pt states that this was done a long time ago   [place]   Procedure: RADIOACTIVE SEED IMPLANT/BRACHYTHERAPY IMPLANT WITH CYSTOSCOPY;  Surgeon: BRaynelle Bring MD;  [facility]  Service: Urology;  Laterality: N/A;   SPACE OAR INSTILLATION N/A 07/15/2021   Procedure: SPACE OAR INSTILLATION;  Surgeon: BRaynelle Bring MD;  [facility];  Service: Urology;  Laterality: N/A;   THYROID LOBECTOMY Left 08/12/2013   Procedure: THYROID LOBECTOMY;  Surgeon: Earnstine Regal, MD;  [facility];  Service: General;  Laterality: Left;    Outpatient Medications Prior to Visit  Medication Sig Dispense Refill   atorvastatin (LIPITOR) 40 MG tablet Take 1 tablet (40 mg total) by mouth daily. 90 tablet 3   Coenzyme Q10 (COQ10) 200 MG CAPS Take 200 mg by mouth daily.     meloxicam (MOBIC) 7.5 MG tablet 1-2 tabs po qd prn pain 60 tablet 2   triamcinolone  (NASACORT) 55 MCG/ACT AERO nasal inhaler Place 2 sprays into the nose daily.     augmented betamethasone dipropionate (DIPROLENE-AF) 0.05 % cream Apply topically 2 (two) times daily as needed. (Patient not taking: Reported on 07/03/2022)     docusate sodium (COLACE) 100 MG capsule Take 1 capsule (100 mg total) by mouth 2 (two) times daily. (Patient not taking: Reported on 07/03/2022) 30 capsule 0   ondansetron (ZOFRAN) 4 MG tablet Take 1 tablet (4 mg total) by mouth every 6 (six) hours. (Patient not taking: Reported on 07/03/2022) 12 tablet 0   scopolamine (TRANSDERM-SCOP) 1 MG/3DAYS Place 1 patch (1.5 mg total) onto the skin every 3 (three) days. (Patient not taking: Reported on 07/03/2022) 12 patch 1   tamsulosin (FLOMAX) 0.4 MG CAPS capsule Take 1 capsule (0.4 mg total) by mouth at bedtime. (Patient not taking: Reported on 07/03/2022) 30 capsule 0   traMADol (ULTRAM) 50 MG tablet Take 1-2 tablets (50-100 mg total) by mouth every 6 (six) hours as needed (pain). (Patient not taking: Reported on 07/03/2022) 15 tablet 0   No facility-administered medications prior to visit.    No Known Allergies  Review of Systems As per HPI  PE:    07/03/2022    8:11 AM 01/11/2022   11:54 AM 01/11/2022   11:00 AM  Vitals with BMI  Height 5' 5.75"    Weight 171 lbs 13 oz    BMI 0000000    Systolic [id number]  Diastolic 77 71 69  Pulse 60 68 63  Rpt bp today 140/76   Physical Exam  Gen: Alert, well appearing.  Patient is oriented to person, place, time, and situation. AFFECT: pleasant, lucid thought and speech. ENT: Ears: EACs clear, normal epithelium.  TMs with good light reflex and landmarks bilaterally.  Eyes: no injection, icteris, swelling, or exudate.  EOMI, PERRLA. Nose: no drainage or turbinate edema/swelling.  No injection or focal lesion.  Mouth: lips without lesion/swelling.  Oral mucosa pink and moist.  Dentition intact and without obvious caries or gingival swelling.  Oropharynx without  erythema, exudate, or swelling.  CV: RRR, no m/r/g.   LUNGS: CTA bilat, nonlabored resps, good aeration in all lung fields. EXT: no clubbing or cyanosis.  no edema.    LABS:  Last CBC Lab Results  Component Value Date   WBC 12.3 (H) 01/11/2022   HGB 16.7 01/11/2022   HCT 50.1 01/11/2022   MCV 89.1 01/11/2022   MCH 29.7 01/11/2022   RDW 13.7 01/11/2022   PLT 315 [id number]   Last metabolic panel Lab Results  Component Value Date   GLUCOSE 218 (H) 01/11/2022   NA 142 01/11/2022   K 4.2 01/11/2022   CL 110 01/11/2022   CO2 24 01/11/2022   BUN 23 01/11/2022   CREATININE 1.00 01/11/2022   GFRNONAA >60 01/11/2022   CALCIUM 9.6 01/11/2022   PROT 8.3 (H)  01/11/2022   ALBUMIN 4.6 01/11/2022   BILITOT 0.9 01/11/2022   ALKPHOS 46 01/11/2022   AST 22 01/11/2022   ALT 21 01/11/2022   ANIONGAP 8 01/11/2022   Last lipids Lab Results  Component Value Date   CHOL 134 01/02/2022   HDL 57.10 01/02/2022   LDLCALC 66 01/02/2022   TRIG 53.0 01/02/2022   CHOLHDL 2 01/02/2022   Last hemoglobin A1c Lab Results  Component Value Date   HGBA1C 5.9 01/02/2022   Last thyroid functions Lab Results  Component Value Date   TSH 1.86 01/02/2022   T3TOTAL 119 05/23/2019   T4TOTAL 8.8 06/17/2013   Lab Results  Component Value Date   PSA 0.33 03/05/2022   PSA 0.45 01/02/2022   PSA 4.92 12/12/2020   IMPRESSION AND PLAN:  #1 viral URI, lingering fatigue. Reassured. Hydrate, rest.  2.  Elevated blood pressure without diagnosis of hypertension.  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 Continue periodic home blood pressure monitoring.  3.  Hyperlipidemia, doing well on atorvastatin 40 mg a day. Lipid panel and hepatic panel today.  #4 prediabetes.  Says he is not eating a very good diet--essentially eats what he wants. However he is playing a lot of pickle ball. Hemoglobin A1c today is excellent-->5.4%.  An After Visit Summary was printed and given to the patient.  FOLLOW  UP: No follow-ups on file.  Signed:  Crissie Sickles, MD           07/03/2022

## 2022-08-28 ENCOUNTER — Telehealth: Payer: Self-pay | Admitting: Family Medicine

## 2022-08-28 DIAGNOSIS — I728 Aneurysm of other specified arteries: Secondary | ICD-10-CM

## 2022-08-28 NOTE — Telephone Encounter (Signed)
Please call patient and ask him to make appointment with me.  His lung doctor called me today and let me know about a vascular abnormality that was picked up on recent lung imaging. Curtez and I need to discuss how we are going to follow this and get it further evaluated. If he is unable to follow-up with me in the next 1 week let me know so I can order some imaging tests to get going between now and then. --thx

## 2022-08-28 NOTE — Telephone Encounter (Signed)
Pt has been scheduled for 5/15

## 2022-08-29 ENCOUNTER — Telehealth: Payer: Self-pay

## 2022-08-29 NOTE — Telephone Encounter (Signed)
[  9:41 AM] Earley Favor  Pls call Jahmeek Pechacek [phone] and tell him I've ordered a special CT scan to be done to look at the aneurism in his abdomen closer and I have also submitted an order for referral to a vascular surgeon to get further expert evaluation.  He'll be getting contacted to set these up.  Keep plan for f/u with me as already set up.

## 2022-08-29 NOTE — Telephone Encounter (Signed)
Pt advised of recommendations.  

## 2022-09-01 DIAGNOSIS — X32XXXD Exposure to sunlight, subsequent encounter: Secondary | ICD-10-CM | POA: Diagnosis not present

## 2022-09-01 DIAGNOSIS — D225 Melanocytic nevi of trunk: Secondary | ICD-10-CM | POA: Diagnosis not present

## 2022-09-01 DIAGNOSIS — L570 Actinic keratosis: Secondary | ICD-10-CM | POA: Diagnosis not present

## 2022-09-01 DIAGNOSIS — Z1283 Encounter for screening for malignant neoplasm of skin: Secondary | ICD-10-CM | POA: Diagnosis not present

## 2022-09-03 ENCOUNTER — Ambulatory Visit (INDEPENDENT_AMBULATORY_CARE_PROVIDER_SITE_OTHER): Payer: Medicare Other | Admitting: Family Medicine

## 2022-09-03 ENCOUNTER — Ambulatory Visit
Admission: RE | Admit: 2022-09-03 | Discharge: 2022-09-03 | Disposition: A | Discharge status: Home / Self Care | Payer: Medicare Other | Source: Ambulatory Visit | Attending: Family Medicine | Admitting: Family Medicine

## 2022-09-03 ENCOUNTER — Encounter: Payer: Self-pay | Admitting: Family Medicine

## 2022-09-03 VITALS — BP 130/88 | HR 69 | Temp 98.0°F | Wt 171.8 lb

## 2022-09-03 DIAGNOSIS — I728 Aneurysm of other specified arteries: Secondary | ICD-10-CM

## 2022-09-03 MED ORDER — IOPAMIDOL (ISOVUE-370) INJECTION 76%
75.0000 mL | Freq: Once | INTRAVENOUS | Status: AC | PRN
  Administered 2022-09-03: 75 mL via INTRAVENOUS

## 2022-09-03 NOTE — Progress Notes (Signed)
OFFICE VISIT  09/03/2022  CC:  Chief Complaint  Patient presents with   Follow-up    Discuss imaging. No other questions or concerns.     Patient is a 72 y.o. male who presents for discussion of recent chest CT imaging.  HPI: On 08/28/2022 he got a routine CT chest screening for his history of occupational asbestos exposure.  This was done by his pulmonologist Dr. Esau Grew, in [facility]. 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  A follow-up CT angiogram of the abdomen was recommended for further assessment.  Patient states he feels completely well.  He has no abdominal pain, no groin pain. He has good appetite and no symptoms after he eats.   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 Adah Perl.->CT good 03/2019.   Basal cell carcinoma nose   removed approx year 2000   Carpal tunnel syndrome on both sides    wrist splints no help   Elevated blood pressure reading without diagnosis of hypertension    blood pressure has been slightly elevated at times - but lost weight - and has never required b/p meds   Elevated PSA    History of adenomatous polyp of colon 05/24/2018   Multiple colonoscopies.  Most recent 05/24/18-adenoma x 1, recall 5 yrs ([facility])   History of kidney stones    Analysis 2019->Ca++ oxalate.   History of thyroid nodule 2014   nodule found on chest CT 2014.  Resected by Dr. Raynaldo Opitz lobectomy->benign   Hypercholesterolemia    IFG (impaired fasting glucose)    115 03/2018.  A1c 5.6% and fasting gluc 112 Feb 2021. A1c 5.6% Nov 2021.   Prostate cancer ([facility]) 01/2021   Elev PSA->Bx + Prost ca 02/14/21-->Rad seed implants 07/2021.    Past Surgical History:  Procedure Laterality  Date   ABI's  02/2019   Bilat: NORMAL    basal cell carcinoma area removed     more than 20 yrs ago   COLONOSCOPY     multiple   CYSTOSCOPY WITH STENT PLACEMENT Left 01/31/2018   Procedure: CYSTOSCOPY WITH RIGHT URETERAL STENT PLACEMENT;  Surgeon: Heloise Purpura, MD;  [facility];  Service: Urology;  Laterality: Left;   CYSTOSCOPY/URETEROSCOPY/HOLMIUM LASER/STENT PLACEMENT Left 02/08/2018   For extraction of L ureteral stone, L ureteroscopic laser lithotripsy, + ureteral stent placement. Procedure: CYSTOSCOPY/URETEROSCOPY/HOLMIUM LASER/STENT PLACEMENT;  Surgeon: Heloise Purpura, MD;  [facility];  Service: Urology;  Laterality: Left;  1 HR   LITHOTRIPSY     pt states that this was done a long time ago   RADIOACTIVE SEED IMPLANT N/A 07/15/2021   Procedure: RADIOACTIVE SEED IMPLANT/BRACHYTHERAPY IMPLANT WITH CYSTOSCOPY;  Surgeon: Heloise Purpura, MD;  Location: [facility];  Service: Urology;  Laterality: N/A;   SPACE OAR INSTILLATION N/A 07/15/2021   Procedure: SPACE OAR INSTILLATION;  Surgeon: Heloise Purpura, MD;  Location: [facility];  Service: Urology;  Laterality: N/A;   THYROID LOBECTOMY Left 08/12/2013   Procedure: THYROID LOBECTOMY;  Surgeon: Velora Heckler, MD;  [facility];  Service: General;  Laterality: Left;    Outpatient Medications Prior to Visit  Medication Sig Dispense Refill   atorvastatin (LIPITOR) 40 MG tablet  Take 1 tablet (40 mg total) by mouth daily. 90 tablet 3   Coenzyme Q10 (COQ10) 200 MG CAPS Take 200 mg by mouth daily.     meloxicam (MOBIC) 7.5 MG tablet 1-2 tabs po qd prn pain 60 tablet 2   triamcinolone (NASACORT) 55 MCG/ACT AERO nasal inhaler Place 2 sprays into the nose daily. (Patient not taking: Reported on 09/03/2022)     No facility-administered medications prior to visit.    No Known Allergies  Review of Systems As per HPI  PE:    09/03/2022    8:52 AM 09/03/2022    8:37 AM 07/03/2022    8:32 AM  Vitals  with BMI  Weight  171 lbs 13 oz   Systolic 130 159 191  Diastolic 88 90 76  Pulse  69      Physical Exam  Gen: Alert, well appearing.  Patient is oriented to person, place, time, and situation. AFFECT: pleasant, lucid thought and speech. ABD: soft, NT, ND, BS normal.  No hepatospenomegaly or mass.  No bruits. 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 HIs CT angio of abdomen is scheduled for 2:00 this afternoon. He has been referred to vascular surgery but they are awaiting the results of today's imaging before scheduling appointment.  An After Visit Summary was printed and given to the patient.  FOLLOW UP: Return for keep appt set for Sept this year.  Signed:  Santiago Bumpers, MD           09/03/2022

## 2022-09-05 ENCOUNTER — Telehealth: Payer: Self-pay | Admitting: Family Medicine

## 2022-09-05 NOTE — Telephone Encounter (Signed)
  Patient would like a call regarding his CT scan results that he had on the 15th. Please give the patient a call when results become available.

## 2022-09-05 NOTE — Telephone Encounter (Signed)
Spoke with patient regarding results/recommendations.  

## 2022-10-03 ENCOUNTER — Encounter: Payer: Self-pay | Admitting: Vascular Surgery

## 2022-10-03 ENCOUNTER — Ambulatory Visit: Payer: Medicare Other | Admitting: Vascular Surgery

## 2022-10-03 VITALS — BP 152/81 | HR 75 | Temp 98.4°F | Resp 20 | Ht 65.75 in | Wt 171.0 lb

## 2022-10-03 DIAGNOSIS — R9389 Abnormal findings on diagnostic imaging of other specified body structures: Secondary | ICD-10-CM

## 2022-10-03 NOTE — Progress Notes (Signed)
Office Note     CC: Concern for celiac artery aneurysm Requesting Provider:  Jeoffrey Massed, MD  HPI: Jason Hansen. is a 72 y.o. (1950-05-19) male presenting at the request of .McGowen, Maryjean Morn, MD due to concern for celiac artery aneurysm.  On exam, Jason Hansen was doing well.  A native of [place], he graduated from [employer].  He works for [employer] for years, prior to retirement in 2016.  He spends his time now being very active, playing pickle ball on a regular basis.  Receives yearly CTs as he was exposed to asbestos for years at work.  Jason Hansen had imaging in the [facility] concerning for celiac artery aneurysm.  This was followed up with CT scan which demonstrated no such aneurysm.  He presents today to discuss these findings.  Jason Hansen has a normal diet, bowel movements, no postprandial pain.  He denies abdominal pain.  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 Adah Perl.->CT good 03/2019.   Basal cell carcinoma nose   removed approx year 2000   Carpal tunnel syndrome on both sides    wrist splints no help   Elevated blood pressure reading without diagnosis of hypertension    blood pressure has been slightly elevated at times - but lost weight - and has never required b/p meds   Elevated PSA    History of adenomatous polyp of colon 05/24/2018   Multiple colonoscopies.  Most recent 05/24/18-adenoma x 1, recall 5 yrs ([facility])   History of kidney stones    Analysis 2019->Ca++ oxalate.   History of thyroid nodule 2014   nodule found on chest CT 2014.  Resected by Dr. Raynaldo Opitz lobectomy->benign   Hypercholesterolemia    IFG (impaired fasting glucose)    115 03/2018.  A1c 5.6% and fasting gluc 112 Feb 2021. A1c 5.6% Nov 2021.   Prostate cancer (HCC) 01/2021   Elev PSA->Bx + Prost ca  02/14/21-->Rad seed implants 07/2021.    Past Surgical History:  Procedure Laterality Date   ABI's  02/2019   Bilat: NORMAL    basal cell carcinoma area removed     more than 20 yrs ago   COLONOSCOPY     multiple   CYSTOSCOPY WITH STENT PLACEMENT Left 01/31/2018   Procedure: CYSTOSCOPY WITH RIGHT URETERAL STENT PLACEMENT;  Surgeon: Heloise Purpura, MD;  [facility];  Service: Urology;  Laterality: Left;   CYSTOSCOPY/URETEROSCOPY/HOLMIUM LASER/STENT PLACEMENT Left 02/08/2018   For extraction of L ureteral stone, L ureteroscopic laser lithotripsy, + ureteral stent placement. Procedure: CYSTOSCOPY/URETEROSCOPY/HOLMIUM LASER/STENT PLACEMENT;  Surgeon: Heloise Purpura, MD;  [facility];  Service: Urology;  Laterality: Left;  1 HR   LITHOTRIPSY     pt states that this was done a long time ago   RADIOACTIVE SEED IMPLANT N/A 07/15/2021   Procedure: RADIOACTIVE SEED IMPLANT/BRACHYTHERAPY IMPLANT WITH CYSTOSCOPY;  Surgeon: Heloise Purpura, MD;  Location: [facility];  Service: Urology;  Laterality: N/A;   SPACE OAR INSTILLATION N/A 07/15/2021   Procedure: SPACE OAR INSTILLATION;  Surgeon: Heloise Purpura, MD;  Location: [facility];  Service: Urology;  Laterality: N/A;   THYROID LOBECTOMY Left 08/12/2013   Procedure: THYROID LOBECTOMY;  Surgeon: Velora Heckler, MD;  [facility];  Service: General;  Laterality: Left;  Social History   Socioeconomic History   Marital status: Married    Spouse name: Not on file   Number of children: Not on file   Years of education: Not on file   Highest education level: Not on file  Occupational History   Not on file  Tobacco Use   Smoking status: Former    Packs/day: 1.00    Years: 10.00    Additional pack years: 0.00    Total pack years: 10.00    Types: Cigarettes   Smokeless tobacco: Former    Types: Associate Professor Use: Never used  Substance and Sexual Activity   Alcohol use: Yes    Comment:  quit smoking 45 yrs ago  alcohol occ beer   Drug use: No   Sexual activity: Not on file  Other Topics Concern   Not on file  Social History Narrative   Married, 2 sons grown.  4 GC.   Orig from [place].   Occup: retired from [employer] at the [employer] on [address].   No Tob.   Occ alcohol.   Social Determinants of Health   Financial Resource Strain: Low Risk  (11/20/2021)   Overall Financial Resource Strain (CARDIA)    Difficulty of Paying Living Expenses: Not hard at all  Food Insecurity: No Food Insecurity (11/20/2021)   Hunger Vital Sign    Worried About Running Out of Food in the Last Year: Never true    Ran Out of Food in the Last Year: Never true  Transportation Needs: No Transportation Needs (11/20/2021)   PRAPARE - Administrator, Civil Service (Medical): No    Lack of Transportation (Non-Medical): No  Physical Activity: Sufficiently Active (11/20/2021)   Exercise Vital Sign    Days of Exercise per Week: 4 days    Minutes of Exercise per Session: 150+ min  Stress: No Stress Concern Present (11/20/2021)   [employer] of Occupational Health - Occupational Stress Questionnaire    Feeling of Stress : Not at all  Social Connections: Moderately Integrated (11/20/2021)   Social Connection and Isolation Panel [NHANES]    Frequency of Communication with Friends and Family: More than three times a week    Frequency of Social Gatherings with Friends and Family: More than three times a week    Attends Religious Services: More than 4 times per year    Active Member of [employer] or Organizations: No    Attends Banker Meetings: Never    Marital Status: Married  Catering manager Violence: Not At Risk (11/20/2021)   Humiliation, Afraid, Rape, and Kick questionnaire    Fear of Current or Ex-Partner: No    Emotionally Abused: No    Physically Abused: No    Sexually Abused: No   Family History  Problem Relation Age of Onset   Breast cancer Mother    Cancer  Mother    Colon cancer Father    Breast cancer Sister     Current Outpatient Medications  Medication Sig Dispense Refill   atorvastatin (LIPITOR) 40 MG tablet Take 1 tablet (40 mg total) by mouth daily. 90 tablet 3   Coenzyme Q10 (COQ10) 200 MG CAPS Take 200 mg by mouth daily.     meloxicam (MOBIC) 7.5 MG tablet 1-2 tabs po qd prn pain 60 tablet 2   triamcinolone (NASACORT) 55 MCG/ACT AERO nasal inhaler Place 2 sprays into the nose daily.     No current facility-administered medications  for this visit.    No Known Allergies   REVIEW OF SYSTEMS:  [X]  denotes positive finding, [ ]  denotes negative finding Cardiac  Comments:  Chest pain or chest pressure:    Shortness of breath upon exertion:    Short of breath when lying flat:    Irregular heart rhythm:        Vascular    Pain in calf, thigh, or hip brought on by ambulation:    Pain in feet at night that wakes you up from your sleep:     Blood clot in your veins:    Leg swelling:         Pulmonary    Oxygen at home:    Productive cough:     Wheezing:         Neurologic    Sudden weakness in arms or legs:     Sudden numbness in arms or legs:     Sudden onset of difficulty speaking or slurred speech:    Temporary loss of vision in one eye:     Problems with dizziness:         Gastrointestinal    Blood in stool:     Vomited blood:         Genitourinary    Burning when urinating:     Blood in urine:        Psychiatric    Major depression:         Hematologic    Bleeding problems:    Problems with blood clotting too easily:        Skin    Rashes or ulcers:        Constitutional    Fever or chills:      PHYSICAL EXAMINATION:  Vitals:   10/03/22 1329  BP: (!) 152/81  Pulse: 75  Resp: 20  Temp: 98.4 F (36.9 C)  SpO2: 92%  Weight: 171 lb (77.6 kg)  Height: 5' 5.75" (1.67 m)    General:  WDWN in NAD; vital signs documented above Gait: Not observed HENT: WNL, normocephalic Pulmonary: normal  non-labored breathing , without wheezing Cardiac: regular HR Abdomen: soft, NT, no masses Skin: without rashes Vascular Exam/Pulses:  Right Left  Radial 2+ (normal) 2+ (normal)  Ulnar    Femoral    Popliteal    DP 2+ (normal) 2+ (normal)  PT     Extremities: without ischemic changes, without Gangrene , without cellulitis; without open wounds;  Musculoskeletal: no muscle wasting or atrophy  Neurologic: A&O X 3;  No focal weakness or paresthesias are detected Psychiatric:  The pt has Normal affect.   Non-Invasive Vascular Imaging:   No celiac aneurysm appreciated    ASSESSMENT/PLAN: Jason Hansen. is a 72 y.o. male presenting with discern for celiac artery aneurysm.  Recent CT scan demonstrates no celiac artery aneurysm.  There is small amount of post stenotic dilation which can come from the median arcuate ligament lying on the apical surface of the celiac artery.  The artery is still within normal limits.  No need for further follow-up.   Jason Sparrow, MD [facility] [phone]

## 2022-11-19 ENCOUNTER — Encounter (INDEPENDENT_AMBULATORY_CARE_PROVIDER_SITE_OTHER): Payer: Self-pay

## 2022-12-04 ENCOUNTER — Encounter (INDEPENDENT_AMBULATORY_CARE_PROVIDER_SITE_OTHER): Payer: Self-pay

## 2022-12-31 ENCOUNTER — Ambulatory Visit (INDEPENDENT_AMBULATORY_CARE_PROVIDER_SITE_OTHER): Payer: Medicare Other

## 2022-12-31 VITALS — Wt 171.0 lb

## 2022-12-31 DIAGNOSIS — Z0000 Encounter for general adult medical examination without abnormal findings: Secondary | ICD-10-CM | POA: Diagnosis not present

## 2022-12-31 NOTE — Progress Notes (Addendum)
Subjective:   Jason Hansen. is a 72 y.o. male who presents for Medicare Annual/Subsequent preventive examination.  Visit Complete: Virtual  I connected with  Gerrit Heck. on 01/05/23 by a audio enabled telemedicine application and verified that I am speaking with the correct person using two identifiers.  Vital Signs: Unable to obtain new vitals due to this being a telehealth visit.   Patient Location: Home  Provider Location: Home Office  I discussed the limitations of evaluation and management by telemedicine. The patient expressed understanding and agreed to proceed.    Review of Systems     Cardiac Risk Factors include: advanced age (>4men, >69 women);male gender     Objective:    Today's Vitals   12/31/22 1304  Weight: 171 lb (77.6 kg)   Body mass index is 27.81 kg/m.     12/31/2022    1:07 PM 01/11/2022    7:27 AM 11/20/2021    9:50 AM 08/16/2021    9:50 AM 07/15/2021   10:33 AM 05/23/2021   10:46 AM 04/02/2021    8:07 AM  Advanced Directives  Does Patient Have a Medical Advance Directive? Yes No Yes Yes No Yes Yes  Type of Estate agent of [place];Living will  Healthcare Power of [place];Living will Living will;Healthcare Power of Attorney  Living will;Healthcare Power of [employer] Power of [place];Living will  Does patient want to make changes to medical advance directive?      No - Patient declined   Copy of Healthcare Power of Attorney in Chart? No - copy requested  No - copy requested        Current Medications (verified) Outpatient Encounter Medications as of 12/31/2022  Medication Sig   atorvastatin (LIPITOR) 40 MG tablet Take 1 tablet (40 mg total) by mouth daily.   Coenzyme Q10 (COQ10) 200 MG CAPS Take 200 mg by mouth daily.   meloxicam (MOBIC) 7.5 MG tablet 1-2 tabs po qd prn pain   triamcinolone (NASACORT) 55 MCG/ACT AERO nasal inhaler Place 2 sprays into the nose daily.   No facility-administered  encounter medications on file as of 12/31/2022.    Allergies (verified) Patient has no known allergies.   History: Past Medical History:  Diagnosis Date   Arthritis    both shoulders   Asbestosis (HCC)    exposed at work for [employer]- no symptoms at this time (11/2018): gets annual f/u imaging and PFTs with pulm Dr. Adah Perl.->CT good 03/2019.   Basal cell carcinoma nose   removed approx year 2000   Carpal tunnel syndrome on both sides    wrist splints no help   Elevated blood pressure reading without diagnosis of hypertension    blood pressure has been slightly elevated at times - but lost weight - and has never required b/p meds   Elevated PSA    History of adenomatous polyp of colon 05/24/2018   Multiple colonoscopies.  Most recent 05/24/18-adenoma x 1, recall 5 yrs ([facility])   History of kidney stones    Analysis 2019->Ca++ oxalate.   History of thyroid nodule 2014   nodule found on chest CT 2014.  Resected by Dr. Raynaldo Opitz lobectomy->benign   Hypercholesterolemia    IFG (impaired fasting glucose)    115 03/2018.  A1c 5.6% and fasting gluc 112 Feb 2021. A1c 5.6% Nov 2021.   Prostate cancer (HCC) 01/2021   Elev PSA->Bx + Prost ca 02/14/21-->Rad seed implants 07/2021.   Past Surgical History:  Procedure  Laterality Date   ABI's  02/2019   Bilat: NORMAL    basal cell carcinoma area removed     more than 20 yrs ago   COLONOSCOPY     multiple   CYSTOSCOPY WITH STENT PLACEMENT Left 01/31/2018   Procedure: CYSTOSCOPY WITH RIGHT URETERAL STENT PLACEMENT;  Surgeon: Heloise Purpura, MD;  Location: [facility];  Service: Urology;  Laterality: Left;   CYSTOSCOPY/URETEROSCOPY/HOLMIUM LASER/STENT PLACEMENT Left 02/08/2018   For extraction of L ureteral stone, L ureteroscopic laser lithotripsy, + ureteral stent placement. Procedure: CYSTOSCOPY/URETEROSCOPY/HOLMIUM LASER/STENT PLACEMENT;  Surgeon: Heloise Purpura, MD;  Location: [facility];  Service: Urology;  Laterality: Left;  1 HR    LITHOTRIPSY     pt states that this was done a long time ago   RADIOACTIVE SEED IMPLANT N/A 07/15/2021   Procedure: RADIOACTIVE SEED IMPLANT/BRACHYTHERAPY IMPLANT WITH CYSTOSCOPY;  Surgeon: Heloise Purpura, MD;  Location: [facility];  Service: Urology;  Laterality: N/A;   SPACE OAR INSTILLATION N/A 07/15/2021   Procedure: SPACE OAR INSTILLATION;  Surgeon: Heloise Purpura, MD;  Location: [facility];  Service: Urology;  Laterality: N/A;   THYROID LOBECTOMY Left 08/12/2013   Procedure: THYROID LOBECTOMY;  Surgeon: Velora Heckler, MD;  Location: [facility];  Service: General;  Laterality: Left;   Family History  Problem Relation Age of Onset   Breast cancer Mother    Cancer Mother    Colon cancer Father    Breast cancer Sister    Social History   Socioeconomic History   Marital status: Married    Spouse name: Not on file   Number of children: Not on file   Years of education: Not on file   Highest education level: Not on file  Occupational History   Not on file  Tobacco Use   Smoking status: Former    Current packs/day: 1.00    Average packs/day: 1 pack/day for 10.0 years (10.0 ttl pk-yrs)    Types: Cigarettes   Smokeless tobacco: Former    Types: Engineer, drilling   Vaping status: Never Used  Substance and Sexual Activity   Alcohol use: Yes    Comment: quit smoking 45 yrs ago  alcohol occ beer   Drug use: No   Sexual activity: Not on file  Other Topics Concern   Not on file  Social History Narrative   Married, 2 sons grown.  4 GC.   Orig from [place].   Occup: retired from [employer] at the [employer] on [address].   No Tob.   Occ alcohol.   Social Determinants of Health   Financial Resource Strain: Low Risk  (12/31/2022)   Overall Financial Resource Strain (CARDIA)    Difficulty of Paying Living Expenses: Not hard at all  Food Insecurity: No Food Insecurity (12/31/2022)   Hunger Vital Sign    Worried About Running Out of Food in the Last  Year: Never true    Ran Out of Food in the Last Year: Never true  Transportation Needs: No Transportation Needs (12/31/2022)   PRAPARE - Administrator, Civil Service (Medical): No    Lack of Transportation (Non-Medical): No  Physical Activity: Sufficiently Active (12/31/2022)   Exercise Vital Sign    Days of Exercise per Week: 5 days    Minutes of Exercise per Session: 150+ min  Stress: No Stress Concern Present (12/31/2022)   [employer] of Occupational Health - Occupational Stress Questionnaire    Feeling of Stress :  Not at all  Social Connections: Moderately Integrated (12/31/2022)   Social Connection and Isolation Panel [NHANES]    Frequency of Communication with Friends and Family: More than three times a week    Frequency of Social Gatherings with Friends and Family: More than three times a week    Attends Religious Services: More than 4 times per year    Active Member of [employer] or Organizations: No    Attends Engineer, structural: Never    Marital Status: Married    Tobacco Counseling Counseling given: Not Answered   Clinical Intake:  Pre-visit preparation completed: Yes  Pain : No/denies pain     BMI - recorded: 27.81 Nutritional Status: BMI 25 -29 Overweight Nutritional Risks: None Diabetes: No  How often do you need to have someone help you when you read instructions, pamphlets, or other written materials from your doctor or pharmacy?: 1 - Never  Interpreter Needed?: No  Information entered by :: Lanier Ensign, LPN   Activities of Daily Living    12/31/2022    1:05 PM  In your present state of health, do you have any difficulty performing the following activities:  Hearing? 0  Vision? 0  Difficulty concentrating or making decisions? 0  Walking or climbing stairs? 0  Dressing or bathing? 0  Doing errands, shopping? 0  Preparing Food and eating ? N  Using the Toilet? N  In the past six months, have you accidently leaked urine?  N  Do you have problems with loss of bowel control? N  Managing your Medications? N  Managing your Finances? N  Housekeeping or managing your Housekeeping? N    Patient Care Team: Jeoffrey Massed, MD as PCP - General (Family Medicine) Darnell Level, MD as Consulting Physician (General Surgery) Heloise Purpura, MD as Consulting Physician (Urology) Valorie Roosevelt Mila Homer, MD as Consulting Physician (Pulmonary Disease) Cherlyn Cushing, RN as Oncology Nurse Navigator  Indicate any recent Medical Services you may have received from other than [facility] providers in the past year (date may be approximate).     Assessment:   This is a routine wellness examination for [facility].  Hearing/Vision screen Hearing Screening - Comments:: Pt denies any hearing issues  Vision Screening - Comments:: Pt follows up with Dr at [facility] for annual eye exams    Goals Addressed             This Visit's Progress    Patient Stated       Continue to be active and healthy       Depression Screen    12/31/2022    1:07 PM 09/03/2022    9:12 AM 07/03/2022    8:15 AM 11/20/2021    9:49 AM 11/14/2020    1:31 PM 09/11/2020    8:22 AM 05/26/2019    8:02 AM  PHQ 2/9 Scores  PHQ - 2 Score 0 0 0 0 0 0 0    Fall Risk    12/31/2022    1:08 PM 09/03/2022    9:12 AM 07/03/2022    8:15 AM 11/20/2021    9:51 AM 11/14/2020    1:32 PM  Fall Risk   Falls in the past year? 0 0 0 0 0  Number falls in past yr: 0 0 0 0 0  Injury with Fall? 0 0 0 0 0  Risk for fall due to : No Fall Risks No Fall Risks No Fall Risks Impaired vision   Follow up Falls prevention discussed Falls  evaluation completed Falls evaluation completed Falls prevention discussed Falls evaluation completed;Falls prevention discussed    MEDICARE RISK AT HOME: Medicare Risk at Home Any stairs in or around the home?: Yes If so, are there any without handrails?: No Home free of loose throw rugs in walkways, pet beds, electrical cords, etc?: Yes Adequate lighting  in your home to reduce risk of falls?: Yes Life alert?: No Use of a cane, walker or w/c?: No Grab bars in the bathroom?: Yes Shower chair or bench in shower?: Yes Elevated toilet seat or a handicapped toilet?: Yes  TIMED UP AND GO:  Was the test performed?  No    Cognitive Function:        12/31/2022    1:10 PM 11/20/2021    9:52 AM  6CIT Screen  What Year? 0 points 0 points  What month? 0 points 0 points  What time? 0 points 0 points  Count back from 20 0 points 0 points  Months in reverse 0 points 4 points  Repeat phrase 0 points 6 points  Total Score 0 points 10 points    Immunizations Immunization History  Administered Date(s) Administered   Fluad Quad(high Dose 65+) 02/12/2017, 03/02/2019, 03/14/2020, 01/02/2022   Influenza, High Dose Seasonal PF 02/12/2017   Moderna Sars-Covid-2 Vaccination 06/20/2019, 08/20/2019   Pneumococcal Conjugate-13 02/12/2017   Pneumococcal Polysaccharide-23 03/02/2019   Td 04/22/2007   Tdap 11/22/2014    TDAP status: Up to date  Flu Vaccine status: Due, Education has been provided regarding the importance of this vaccine. Advised may receive this vaccine at local pharmacy or Health Dept. Aware to provide a copy of the vaccination record if obtained from local pharmacy or Health Dept. Verbalized acceptance and understanding.  Pneumococcal vaccine status: Up to date  Covid-19 vaccine status: Information provided on how to obtain vaccines.   Qualifies for Shingles Vaccine? Yes   Zostavax completed No   Shingrix Completed?: No.    Education has been provided regarding the importance of this vaccine. Patient has been advised to call insurance company to determine out of pocket expense if they have not yet received this vaccine. Advised may also receive vaccine at local pharmacy or Health Dept. Verbalized acceptance and understanding.  Screening Tests Health Maintenance  Topic Date Due   Zoster Vaccines- Shingrix (1 of 2) Never done    COVID-19 Vaccine (3 - Moderna risk series) 09/17/2019   INFLUENZA VACCINE  07/20/2023 (Originally 11/20/2022)   Medicare Annual Wellness (AWV)  12/31/2023   DTaP/Tdap/Td (3 - Td or Tdap) 11/21/2024   Colonoscopy  05/24/2028   Pneumonia Vaccine 77+ Years old  Completed   HPV VACCINES  Aged Out   Hepatitis C Screening  Discontinued    Health Maintenance  Health Maintenance Due  Topic Date Due   Zoster Vaccines- Shingrix (1 of 2) Never done   COVID-19 Vaccine (3 - Moderna risk series) 09/17/2019    Colorectal cancer screening: Type of screening: Colonoscopy. Completed 05/24/18. Repeat every 10 years   Additional Screening:  Hepatitis C Screening: does not qualify;   Vision Screening: Recommended annual ophthalmology exams for early detection of glaucoma and other disorders of the eye. Is the patient up to date with their annual eye exam?  Yes  Who is the provider or what is the name of the office in which the patient attends annual eye exams? [facility] If pt is not established with a provider, would they like to be referred to a provider to establish care? No .  Dental Screening: Recommended annual dental exams for proper oral hygiene    Community Resource Referral / Chronic Care Management: CRR required this visit?  No   CCM required this visit?  No     Plan:     I have personally reviewed and noted the following in the patient's chart:   Medical and social history Use of alcohol, tobacco or illicit drugs  Current medications and supplements including opioid prescriptions. Patient is not currently taking opioid prescriptions. Functional ability and status Nutritional status Physical activity Advanced directives List of other physicians Hospitalizations, surgeries, and ER visits in previous 12 months Vitals Screenings to include cognitive, depression, and falls Referrals and appointments  In addition, I have reviewed and discussed with patient certain preventive  protocols, quality metrics, and best practice recommendations. A written personalized care plan for preventive services as well as general preventive health recommendations were provided to patient.     Marzella Schlein, LPN   09/20/930   After Visit Summary: (MyChart) Due to this being a telephonic visit, the after visit summary with patients personalized plan was offered to patient via MyChart   Nurse Notes: none

## 2022-12-31 NOTE — Patient Instructions (Signed)
Jason Hansen , Thank you for taking time to come for your Medicare Wellness Visit. I appreciate your ongoing commitment to your health goals. Please review the following plan we discussed and let me know if I can assist you in the future.   Referrals/Orders/Follow-Ups/Clinician Recommendations: continue to be active and healthy  This is a list of the screening recommended for you and due dates:  Health Maintenance  Topic Date Due   Zoster (Shingles) Vaccine (1 of 2) Never done   COVID-19 Vaccine (3 - Moderna risk series) 09/17/2019   Flu Shot  11/20/2022   Medicare Annual Wellness Visit  11/21/2022   DTaP/Tdap/Td vaccine (3 - Td or Tdap) 11/21/2024   Colon Cancer Screening  05/24/2028   Pneumonia Vaccine  Completed   HPV Vaccine  Aged Out   Hepatitis C Screening  Discontinued    Advanced directives: (Copy Requested) Please bring a copy of your health care power of attorney and living will to the office to be added to your chart at your convenience.  Next Medicare Annual Wellness Visit scheduled for next year: Yes

## 2023-01-07 ENCOUNTER — Encounter: Payer: Medicare Other | Admitting: Family Medicine

## 2023-01-09 ENCOUNTER — Other Ambulatory Visit: Payer: Self-pay | Admitting: Family Medicine

## 2023-01-15 NOTE — Patient Instructions (Addendum)
Shoulder Impingement Syndrome Rehab Ask your health care provider which exercises are safe for you. Do exercises exactly as told by your provider and adjust them as told. It is normal to feel mild stretching, pulling, tightness, or discomfort as you do these exercises. Stop right away if you feel sudden pain or your pain gets worse. Do not begin these exercises until told by your provider. Stretching and range-of-motion exercise This exercise warms up your muscles and joints and improves the movement and flexibility of your shoulder. This exercise also helps to relieve pain and stiffness. Passive horizontal adduction In passive adduction, you use your other hand to move the injured arm toward your body. The injured arm does not move on its own. In this movement, your arm is moved across your body in the horizontal plane (horizontal adduction). Sit or stand and pull your left / right elbow across your chest, toward your other shoulder. Stop when you feel a gentle stretch in the back of your shoulder and upper arm. Keep your arm at shoulder height. Keep your arm as close to your body as you comfortably can. Hold for __________ seconds. Slowly return to the starting position. Repeat __________ times. Complete this exercise __________ times a day. Strengthening exercises These exercises build strength and endurance in your shoulder. Endurance is the ability to use your muscles for a long time, even after they get tired. External rotation, isometric This is an exercise in which you press the back of your wrist against a doorframe without moving your shoulder joint (isometric). Stand or sit in a doorway, facing the door frame. Bend your left / right elbow and place the back of your wrist against the doorframe. Only the back of your wrist should be touching the frame. Keep your upper arm at your side. Gently press your wrist against the doorframe, as if you are trying to push your arm away from your  abdomen (external rotation). Press as hard as you are able without pain. Avoid shrugging your shoulder while you press your wrist against the doorframe. Keep your shoulder blade tucked down toward the middle of your back. Hold for __________ seconds. Slowly release the tension, and relax your muscles completely before you repeat the exercise. Repeat __________ times. Complete this exercise __________ times a day. Internal rotation, isometric This is an exercise in which you press your palm against a doorframe without moving your shoulder joint (isometric). Stand or sit in a doorway, facing the doorframe. Bend your left / right elbow and place the palm of your hand against the doorframe. Only your palm should be touching the frame. Keep your upper arm at your side. Gently press your hand against the doorframe, as if you are trying to push your arm toward your abdomen (internal rotation). Press as hard as you are able without pain. Avoid shrugging your shoulder while you press your hand against the doorframe. Keep your shoulder blade tucked down toward the middle of your back. Hold for __________ seconds. Slowly release the tension, and relax your muscles completely before you repeat the exercise. Repeat __________ times. Complete this exercise __________ times a day. Scapular protraction, supine, isotonic  Lie on your back on a firm surface (supine position). Hold a __________ weight in your left / right hand. Raise your left / right arm straight into the air so your hand is directly above your shoulder joint. Push the weight into the air so your shoulder (scapula) lifts off the surface that you are lying on.  The scapula will push up or forward (protraction). Do not move your head, neck, or back. Hold for __________ seconds. Slowly return to the starting position. Let your muscles relax completely before you repeat this exercise. Repeat __________ times. Complete this exercise __________ times a  day. Scapular retraction, isotonic  Sit in a stable chair without armrests or stand up. Secure an exercise band to a stable object in front of you so the band is at shoulder height. Hold one end of the exercise band in each hand. Squeeze your shoulder blades together (retraction) and move your elbows slightly behind you. Do not shrug your shoulders upward while you do this. Hold for __________ seconds. Slowly return to the starting position. Repeat __________ times. Complete this exercise __________ times a day. Shoulder extension, isotonic  Sit in a stable chair without armrests or stand up. Secure an exercise band to a stable object in front of you so the band is above shoulder height. Hold one end of the exercise band in each hand. Straighten your elbows and lift your hands up to shoulder height. Squeeze your shoulder blades together and pull your hands down to the sides of your thighs (extension). Stop when your hands are straight down by your sides. Do not let your hands go behind your body. Hold for __________ seconds. Slowly return to the starting position. Repeat __________ times. Complete this exercise __________ times a day. This information is not intended to replace advice given to you by your health care provider. Make sure you discuss any questions you have with your health care provider. Document Revised: 01/02/2022 Document Reviewed: 01/02/2022 Elsevier Patient Education  2024 [employer].

## 2023-01-20 ENCOUNTER — Encounter: Payer: Self-pay | Admitting: Family Medicine

## 2023-01-20 ENCOUNTER — Ambulatory Visit (INDEPENDENT_AMBULATORY_CARE_PROVIDER_SITE_OTHER): Payer: Medicare Other | Admitting: Family Medicine

## 2023-01-20 VITALS — BP 120/74 | HR 68 | Temp 98.1°F | Ht 65.75 in | Wt 170.6 lb

## 2023-01-20 DIAGNOSIS — Z9009 Acquired absence of other part of head and neck: Secondary | ICD-10-CM | POA: Diagnosis not present

## 2023-01-20 DIAGNOSIS — R7303 Prediabetes: Secondary | ICD-10-CM | POA: Diagnosis not present

## 2023-01-20 DIAGNOSIS — E7800 Pure hypercholesterolemia, unspecified: Secondary | ICD-10-CM

## 2023-01-20 DIAGNOSIS — Z0000 Encounter for general adult medical examination without abnormal findings: Secondary | ICD-10-CM | POA: Diagnosis not present

## 2023-01-20 DIAGNOSIS — M7540 Impingement syndrome of unspecified shoulder: Secondary | ICD-10-CM

## 2023-01-20 DIAGNOSIS — M25511 Pain in right shoulder: Secondary | ICD-10-CM | POA: Diagnosis not present

## 2023-01-20 DIAGNOSIS — M25512 Pain in left shoulder: Secondary | ICD-10-CM

## 2023-01-20 DIAGNOSIS — Z125 Encounter for screening for malignant neoplasm of prostate: Secondary | ICD-10-CM

## 2023-01-20 LAB — CBC WITH DIFFERENTIAL/PLATELET
Basophils Absolute: 0 10*3/uL (ref 0.0–0.1)
Basophils Relative: 0.8 % (ref 0.0–3.0)
Eosinophils Absolute: 0.2 10*3/uL (ref 0.0–0.7)
Eosinophils Relative: 2.7 % (ref 0.0–5.0)
HCT: 49.1 % (ref 39.0–52.0)
Hemoglobin: 16.1 g/dL (ref 13.0–17.0)
Lymphocytes Relative: 17 % (ref 12.0–46.0)
Lymphs Abs: 1 10*3/uL (ref 0.7–4.0)
MCHC: 32.9 g/dL (ref 30.0–36.0)
MCV: 90.1 fL (ref 78.0–100.0)
Monocytes Absolute: 0.5 10*3/uL (ref 0.1–1.0)
Monocytes Relative: 8.8 % (ref 3.0–12.0)
Neutro Abs: 4.1 10*3/uL (ref 1.4–7.7)
Neutrophils Relative %: 70.7 % (ref 43.0–77.0)
Platelets: 264 10*3/uL (ref 150.0–400.0)
RBC: 5.44 Mil/uL (ref 4.22–5.81)
RDW: 13.8 % (ref 11.5–15.5)
WBC: 5.8 10*3/uL (ref 4.0–10.5)

## 2023-01-20 LAB — TSH: TSH: 1.72 u[IU]/mL (ref 0.35–5.50)

## 2023-01-20 LAB — COMPREHENSIVE METABOLIC PANEL
ALT: 18 U/L (ref 0–53)
AST: 19 U/L (ref 0–37)
Albumin: 4.4 g/dL (ref 3.5–5.2)
Alkaline Phosphatase: 42 U/L (ref 39–117)
BUN: 15 mg/dL (ref 6–23)
CO2: 28 meq/L (ref 19–32)
Calcium: 9.3 mg/dL (ref 8.4–10.5)
Chloride: 106 meq/L (ref 96–112)
Creatinine, Ser: 0.95 mg/dL (ref 0.40–1.50)
GFR: 80.66 mL/min (ref >60.00)
Glucose, Bld: 109 mg/dL — ABNORMAL HIGH (ref 70–99)
Potassium: 4.6 meq/L (ref 3.5–5.1)
Sodium: 140 meq/L (ref 135–145)
Total Bilirubin: 0.9 mg/dL (ref 0.2–1.2)
Total Protein: 6.7 g/dL (ref 6.0–8.3)

## 2023-01-20 LAB — LIPID PANEL
Cholesterol: 139 mg/dL (ref 0–200)
HDL: 59.7 mg/dL (ref >39.00)
LDL Cholesterol: 71 mg/dL (ref 0–99)
NonHDL: 79.4
Total CHOL/HDL Ratio: 2
Triglycerides: 44 mg/dL (ref 0.0–149.0)
VLDL: 8.8 mg/dL (ref 0.0–40.0)

## 2023-01-20 LAB — HEMOGLOBIN A1C: Hgb A1c MFr Bld: 5.7 % (ref 4.6–6.5)

## 2023-01-20 LAB — PSA, MEDICARE: PSA: 0.17 ng/mL (ref 0.10–4.00)

## 2023-01-20 MED ORDER — MELOXICAM 7.5 MG PO TABS: ORAL_TABLET | ORAL | 2 refills | Status: AC

## 2023-01-20 NOTE — Progress Notes (Signed)
Office Note 01/20/2023  CC:  Chief Complaint  Patient presents with   Annual Exam    Pt is fasting    HPI:  Patient is a 72 y.o. male who is here for annual health maintenance exam and follow-up hyperlipidemia and elevated blood pressure without diagnosis of hypertension (white coat).  Blood pressure at his home this morning was 122/70. Blood pressure here today is 120/74.  Both shoulders hurting lately.  Interestingly, range of motion is intact and he says no particular motions elicit the pain.  He put on a CBD salve at night and allows him to have enough resolution of his pain to sleep. He plays pickle ball without problem. Reports a remote history of being told he had arthritis in both shoulders but says no x-rays have ever been done.  He has never received any injections. His shoulders actually did not bother him for many years after his diagnosis.  He has no neck pain.  No radiating arm pain.  No paresthesias.  Left knee has been hurting some lately, medial aspect.  No buckling or catching or locking up or swelling.  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 Adah Perl.->CT good 03/2019.   Basal cell carcinoma nose   removed approx year 2000   Carpal tunnel syndrome on both sides    wrist splints no help   Elevated blood pressure reading without diagnosis of hypertension    blood pressure has been slightly elevated at times - but lost weight - and has never required b/p meds   Elevated PSA    History of adenomatous polyp of colon 05/24/2018   Multiple colonoscopies.  Most recent 05/24/18-adenoma x 1, recall 5 yrs ([facility])   History of kidney stones    Analysis 2019->Ca++ oxalate.   History of thyroid nodule 2014   nodule found on chest CT 2014.  Resected by Dr. Raynaldo Opitz lobectomy->benign    Hypercholesterolemia    IFG (impaired fasting glucose)    115 03/2018.  A1c 5.6% and fasting gluc 112 Feb 2021. A1c 5.6% Nov 2021.   Prostate cancer (HCC) 01/2021   Elev PSA->Bx + Prost ca 02/14/21-->Rad seed implants 07/2021.    Past Surgical History:  Procedure Laterality Date   ABI's  02/2019   Bilat: NORMAL    basal cell carcinoma area removed     more than 20 yrs ago   COLONOSCOPY     multiple   CYSTOSCOPY WITH STENT PLACEMENT Left 01/31/2018   Procedure: CYSTOSCOPY WITH RIGHT URETERAL STENT PLACEMENT;  Surgeon: Heloise Purpura, MD;  Location: [facility];  Service: Urology;  Laterality: Left;   CYSTOSCOPY/URETEROSCOPY/HOLMIUM LASER/STENT PLACEMENT Left 02/08/2018   For extraction of L ureteral stone, L ureteroscopic laser lithotripsy, + ureteral stent placement. Procedure: CYSTOSCOPY/URETEROSCOPY/HOLMIUM LASER/STENT PLACEMENT;  Surgeon: Heloise Purpura, MD;  Location: [facility];  Service: Urology;  Laterality: Left;  1 HR   LITHOTRIPSY     pt states that this was done a long time ago   RADIOACTIVE SEED IMPLANT N/A 07/15/2021   Procedure: RADIOACTIVE SEED IMPLANT/BRACHYTHERAPY IMPLANT WITH CYSTOSCOPY;  Surgeon: Heloise Purpura, MD;  Location: [facility];  Service: Urology;  Laterality: N/A;   SPACE OAR INSTILLATION N/A 07/15/2021   Procedure: SPACE OAR INSTILLATION;  Surgeon: Heloise Purpura, MD;  Location: [facility];  Service: Urology;  Laterality: N/A;   THYROID LOBECTOMY Left 08/12/2013   Procedure: THYROID LOBECTOMY;  Surgeon: Velora Heckler, MD;  Location: [facility];  Service: General;  Laterality: Left;    Family History  Problem Relation Age of Onset   Breast cancer Mother    Cancer Mother    Colon cancer Father    Breast cancer Sister     Social History   Socioeconomic History   Marital status: Married    Spouse name: Not on file   Number of children: Not on file   Years of education: Not on file   Highest education level: Not on file   Occupational History   Not on file  Tobacco Use   Smoking status: Former    Current packs/day: 1.00    Average packs/day: 1 pack/day for 10.0 years (10.0 ttl pk-yrs)    Types: Cigarettes   Smokeless tobacco: Former    Types: Engineer, drilling   Vaping status: Never Used  Substance and Sexual Activity   Alcohol use: Yes    Comment: quit smoking 45 yrs ago  alcohol occ beer   Drug use: No   Sexual activity: Not on file  Other Topics Concern   Not on file  Social History Narrative   Married, 2 sons grown.  4 GC.   Orig from [place].   Occup: retired from [employer] at the [employer] on [address].   No Tob.   Occ alcohol.   Social Determinants of Health   Financial Resource Strain: Low Risk  (12/31/2022)   Overall Financial Resource Strain (CARDIA)    Difficulty of Paying Living Expenses: Not hard at all  Food Insecurity: No Food Insecurity (12/31/2022)   Hunger Vital Sign    Worried About Running Out of Food in the Last Year: Never true    Ran Out of Food in the Last Year: Never true  Transportation Needs: No Transportation Needs (12/31/2022)   PRAPARE - Administrator, Civil Service (Medical): No    Lack of Transportation (Non-Medical): No  Physical Activity: Sufficiently Active (12/31/2022)   Exercise Vital Sign    Days of Exercise per Week: 5 days    Minutes of Exercise per Session: 150+ min  Stress: No Stress Concern Present (12/31/2022)   [employer] of Occupational Health - Occupational Stress Questionnaire    Feeling of Stress : Not at all  Social Connections: Moderately Integrated (12/31/2022)   Social Connection and Isolation Panel [NHANES]    Frequency of Communication with Friends and Family: More than three times a week    Frequency of Social Gatherings with Friends and Family: More than three times a week    Attends Religious Services: More than 4 times per year    Active Member of [employer] or Organizations: No    Attends Banker  Meetings: Never    Marital Status: Married  Catering manager Violence: Not At Risk (12/31/2022)   Humiliation, Afraid, Rape, and Kick questionnaire    Fear of Current or Ex-Partner: No    Emotionally Abused: No    Physically Abused: No    Sexually Abused: No    Outpatient Medications Prior to Visit  Medication Sig Dispense Refill   atorvastatin (LIPITOR) 40 MG tablet TAKE 1 TABLET DAILY 90 tablet 1   Coenzyme Q10 (COQ10) 200 MG CAPS Take 200 mg by mouth daily.     tamsulosin (FLOMAX) 0.4 MG CAPS capsule Take 0.4  mg by mouth.     triamcinolone (NASACORT) 55 MCG/ACT AERO nasal inhaler Place 2 sprays into the nose daily.     meloxicam (MOBIC) 7.5 MG tablet 1-2 tabs po qd prn pain 60 tablet 2   No facility-administered medications prior to visit.    No Known Allergies  Review of Systems  Constitutional:  Negative for appetite change, chills, fatigue and fever.  HENT:  Negative for congestion, dental problem, ear pain and sore throat.   Eyes:  Negative for discharge, redness and visual disturbance.  Respiratory:  Negative for cough, chest tightness, shortness of breath and wheezing.   Cardiovascular:  Negative for chest pain, palpitations and leg swelling.  Gastrointestinal:  Negative for abdominal pain, blood in stool, diarrhea, nausea and vomiting.  Genitourinary:  Negative for difficulty urinating, dysuria, flank pain, frequency, hematuria and urgency.  Musculoskeletal:  Positive for arthralgias (both shoulders and L knee). Negative for back pain, joint swelling, myalgias and neck stiffness.  Skin:  Negative for pallor and rash.  Neurological:  Negative for dizziness, speech difficulty, weakness and headaches.  Hematological:  Negative for adenopathy. Does not bruise/bleed easily.  Psychiatric/Behavioral:  Negative for confusion and sleep disturbance. The patient is not nervous/anxious.    PE;    01/20/2023    9:49 AM 12/31/2022    1:04 PM 10/03/2022    1:29 PM  Vitals with BMI   Height 5' 5.75"  5' 5.75"  Weight 170 lbs 10 oz 171 lbs 171 lbs  BMI 27.75  27.81  Systolic 120  152  Diastolic 74  81  Pulse 68  75    Gen: Alert, well appearing.  Patient is oriented to person, place, time, and situation. AFFECT: pleasant, lucid thought and speech. ENT: Ears: EACs clear, normal epithelium.  TMs with good light reflex and landmarks bilaterally.  Eyes: no injection, icteris, swelling, or exudate.  EOMI, PERRLA. Nose: no drainage or turbinate edema/swelling.  No injection or focal lesion.  Mouth: lips without lesion/swelling.  Oral mucosa pink and moist.  Dentition intact and without obvious caries or gingival swelling.  Oropharynx without erythema, exudate, or swelling.  Neck: supple/nontender.  No LAD, mass, or TM.  Carotid pulses 2+ bilaterally, without bruits. CV: RRR, no m/r/g.   LUNGS: CTA bilat, nonlabored resps, good aeration in all lung fields. ABD: soft, NT, ND, BS normal.  No hepatospenomegaly or mass.  No bruits. EXT: no clubbing, cyanosis, or edema.  Skin - no sores or suspicious lesions or rashes or color changes Shoulders: No deformity.  Active range of motion a little bit limited when trying to make the scarecrow sign.  Stop sign okay.  Hawkins equivocal.  Neer's equivocal. No tenderness about the shoulder girdle.  Strength 5 out of 5 in all motions.  Pertinent labs:  Lab Results  Component Value Date   TSH 1.86 01/02/2022   Lab Results  Component Value Date   WBC 12.3 (H) 01/11/2022   HGB 16.7 01/11/2022   HCT 50.1 01/11/2022   MCV 89.1 01/11/2022   PLT 315 01/11/2022   Lab Results  Component Value Date   CREATININE 0.99 07/03/2022   BUN 15 07/03/2022   NA 140 07/03/2022   K 5.0 07/03/2022   CL 106 07/03/2022   CO2 27 07/03/2022   Lab Results  Component Value Date   ALT 18 07/03/2022   AST 22 07/03/2022   ALKPHOS 45 07/03/2022   BILITOT 0.9 07/03/2022   Lab Results  Component Value Date  CHOL 137 07/03/2022   Lab Results   Component Value Date   HDL 55.90 07/03/2022   Lab Results  Component Value Date   LDLCALC 72 07/03/2022   Lab Results  Component Value Date   TRIG 45.0 07/03/2022   Lab Results  Component Value Date   CHOLHDL 2 07/03/2022   Lab Results  Component Value Date   PSA 0.33 03/05/2022   PSA 0.45 01/02/2022   PSA 4.92 12/12/2020   Lab Results  Component Value Date   HGBA1C 5.4 07/03/2022   HGBA1C 5.4 07/03/2022   HGBA1C 5.4 (A) 07/03/2022   HGBA1C 5.4 07/03/2022   ASSESSMENT AND PLAN:   #1 health maintenance exam: Reviewed age and gender appropriate health maintenance issues (prudent diet, regular exercise, health risks of tobacco and excessive alcohol, use of seatbelts, fire alarms in home, use of sunscreen).  Also reviewed age and gender appropriate health screening as well as vaccine recommendations. Vaccines: Flu->declined.  Shingrix->declined.  Otherwise all UTD. Labs: fasting HP. Prostate ca screening: Has prostate cancer-->brachytherapy, PSAs followed by urology. Colon ca screening: hx of polyps, next colonoscopy due 05/2023.  #2 bilateral shoulder pain.  Exam most consistent with rotator cuff tendinosis with impingement bilaterally. Bedside MSK ultrasound today: Small amount of subacromial/subdeltoid bursal fluid noted bilaterally.  AC joint degenerative changes without acute effusion.  Minimal tendinosis changes in rotator cuff tendons without tear.  Biceps tendon normal, posterior glenohumeral joint unremarkable. I gave home rehab exercises for him to do.  Also prescribed meloxicam 7.5 mg tabs, 1-2 daily as needed. If no improvement over the next month or so then we will consider steroid injection and also get plain radiographs of both shoulders.  #3 elevated blood pressure without diagnosis of hypertension. Blood pressure normal at home this morning and normal here today. Continue occasional home blood pressure monitoring.  4.  Hypercholesterolemia, doing well on a  atorvastatin 40 mg a day. Lipid panel and hepatic panel today.  FOLLOW UP:  Return in about 6 months (around 07/21/2023) for routine chronic illness f/u.  Signed:  Santiago Bumpers, MD           01/20/2023

## 2023-05-20 LAB — PSA: PSA: 0.16

## 2023-05-27 DIAGNOSIS — N200 Calculus of kidney: Secondary | ICD-10-CM | POA: Diagnosis not present

## 2023-07-17 ENCOUNTER — Other Ambulatory Visit: Payer: Self-pay | Admitting: Family Medicine

## 2023-07-17 NOTE — Telephone Encounter (Signed)
 Pt has upcoming appt 07/21/23

## 2023-07-20 NOTE — Progress Notes (Unsigned)
 OFFICE VISIT  07/21/2023  CC:  Chief Complaint  Patient presents with   Medical Management of Chronic Issues    Patient is a 73 y.o. male who presents for 6 mo f/u HLD, elev bp w/out dx HTN, and bilat shoulder pain.  INTERIM HX: Jason Hansen feels well. Home blood pressures average 130/75.  Shoulders feel good now.  He is doing some weight lifting and plays pickle ball.  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 Adah Perl.->CT good 03/2019.   Basal cell carcinoma nose   removed approx year 2000   Carpal tunnel syndrome on both sides    wrist splints no help   Elevated blood pressure reading without diagnosis of hypertension    blood pressure has been slightly elevated at times - but lost weight - and has never required b/p meds   Elevated PSA    History of adenomatous polyp of colon 05/24/2018   Multiple colonoscopies.  Most recent 05/24/18-adenoma x 1, recall 5 yrs ([facility])   History of kidney stones    Analysis 2019->Ca++ oxalate.   History of thyroid nodule 2014   nodule found on chest CT 2014.  Resected by Dr. Raynaldo Opitz lobectomy->benign   Hypercholesterolemia    IFG (impaired fasting glucose)    115 03/2018.  A1c 5.6% and fasting gluc 112 Feb 2021. A1c 5.6% Nov 2021.   Prostate cancer (HCC) 01/2021   Elev PSA->Bx + Prost ca 02/14/21-->Rad seed implants 07/2021.    Past Surgical History:  Procedure Laterality Date   ABI's  02/2019   Bilat: NORMAL    basal cell carcinoma area removed     more than 20 yrs ago   COLONOSCOPY     multiple   CYSTOSCOPY WITH STENT PLACEMENT Left 01/31/2018   Procedure: CYSTOSCOPY WITH RIGHT URETERAL STENT PLACEMENT;  Surgeon: Heloise Purpura, MD;  Location: [facility];  Service: Urology;  Laterality: Left;   CYSTOSCOPY/URETEROSCOPY/HOLMIUM LASER/STENT PLACEMENT Left 02/08/2018    For extraction of L ureteral stone, L ureteroscopic laser lithotripsy, + ureteral stent placement. Procedure: CYSTOSCOPY/URETEROSCOPY/HOLMIUM LASER/STENT PLACEMENT;  Surgeon: Heloise Purpura, MD;  Location: [facility];  Service: Urology;  Laterality: Left;  1 HR   LITHOTRIPSY     pt states that this was done a long time ago   RADIOACTIVE SEED IMPLANT N/A 07/15/2021   Procedure: RADIOACTIVE SEED IMPLANT/BRACHYTHERAPY IMPLANT WITH CYSTOSCOPY;  Surgeon: Heloise Purpura, MD;  Location: [facility];  Service: Urology;  Laterality: N/A;   SPACE OAR INSTILLATION N/A 07/15/2021   Procedure: SPACE OAR INSTILLATION;  Surgeon: Heloise Purpura, MD;  Location: [facility];  Service: Urology;  Laterality: N/A;   THYROID LOBECTOMY Left 08/12/2013   Procedure: THYROID LOBECTOMY;  Surgeon: Velora Heckler, MD;  Location: [facility];  Service: General;  Laterality: Left;    Outpatient Medications Prior to Visit  Medication Sig Dispense Refill   meloxicam (MOBIC) 7.5 MG tablet 1-2 tabs po qd prn pain 60 tablet 2   tamsulosin (FLOMAX) 0.4 MG CAPS capsule Take 0.4 mg by mouth.     Coenzyme Q10 (COQ10) 200 MG CAPS Take 200 mg by mouth daily. (Patient not taking: Reported on 07/21/2023)     triamcinolone (NASACORT) 55 MCG/ACT AERO nasal inhaler Place 2 sprays into the nose daily. (Patient not taking: Reported on  07/21/2023)     atorvastatin (LIPITOR) 40 MG tablet TAKE 1 TABLET DAILY (Patient not taking: Reported on 07/21/2023) 90 tablet 1   No facility-administered medications prior to visit.    No Known Allergies  Review of Systems As per HPI  PE:    07/21/2023    8:22 AM 07/21/2023    8:12 AM 01/20/2023    9:49 AM  Vitals with BMI  Height  5' 5.75" 5' 5.75"  Weight  173 lbs 10 oz 170 lbs 10 oz  BMI  28.23 27.75  Systolic 134 150 161  Diastolic 70 69 74  Pulse  64 68     Physical Exam  Gen: Alert, well appearing.  Patient is oriented to person, place, time, and situation. AFFECT:  pleasant, lucid thought and speech. CV: RRR, no m/r/g.   LUNGS: CTA bilat, nonlabored resps, good aeration in all lung fields. EXT: no clubbing or cyanosis.  no edema.    LABS:  Last CBC Lab Results  Component Value Date   WBC 5.8 01/20/2023   HGB 16.1 01/20/2023   HCT 49.1 01/20/2023   MCV 90.1 01/20/2023   MCH 29.7 01/11/2022   RDW 13.8 01/20/2023   PLT 264.0 01/20/2023   Last metabolic panel Lab Results  Component Value Date   GLUCOSE 109 (H) 01/20/2023   NA 140 01/20/2023   K 4.6 01/20/2023   CL 106 01/20/2023   CO2 28 01/20/2023   BUN 15 01/20/2023   CREATININE 0.95 01/20/2023   GFR 80.66 01/20/2023   CALCIUM 9.3 01/20/2023   PROT 6.7 01/20/2023   ALBUMIN 4.4 01/20/2023   BILITOT 0.9 01/20/2023   ALKPHOS 42 01/20/2023   AST 19 01/20/2023   ALT 18 01/20/2023   ANIONGAP 8 01/11/2022   Last lipids Lab Results  Component Value Date   CHOL 139 01/20/2023   HDL 59.70 01/20/2023   LDLCALC 71 01/20/2023   TRIG 44.0 01/20/2023   CHOLHDL 2 01/20/2023   Last hemoglobin A1c Lab Results  Component Value Date   HGBA1C 5.7 01/20/2023   Lab Results  Component Value Date   TSH 1.72 01/20/2023   Lab Results  Component Value Date   PSA 0.16 05/20/2023   PSA 0.17 01/20/2023   PSA 0.33 03/05/2022   IMPRESSION AND PLAN:  #1 hypercholesterolemia, doing well on a atorvastatin 40 mg a day. Monitor lipids and hepatic panel today.  2.  Elevated blood pressure without diagnosis of hypertension. Continue home blood pressure monitoring and as long as average less than 130/80 at home then no meds indicated.  3.  Bilateral shoulder pain. Resolved. Continue great exercise habits.  #4 impaired fasting glucose. Monitor fasting glucose today. Last hemoglobin A1c was 5.7% about 6 months ago.  Highest A1c was back in 2023 at 5.9%. Plan repeat hemoglobin A1c in 6 months.  An After Visit Summary was printed and given to the patient.  FOLLOW UP: Return in about 6 months  (around 01/20/2024) for annual CPE (fasting).  Signed:  Santiago Bumpers, MD           07/21/2023

## 2023-07-21 ENCOUNTER — Ambulatory Visit (INDEPENDENT_AMBULATORY_CARE_PROVIDER_SITE_OTHER): Payer: Medicare Other | Admitting: Family Medicine

## 2023-07-21 ENCOUNTER — Encounter: Payer: Self-pay | Admitting: Family Medicine

## 2023-07-21 VITALS — BP 134/70 | HR 64 | Ht 65.75 in | Wt 173.6 lb

## 2023-07-21 DIAGNOSIS — E7800 Pure hypercholesterolemia, unspecified: Secondary | ICD-10-CM | POA: Diagnosis not present

## 2023-07-21 DIAGNOSIS — R030 Elevated blood-pressure reading, without diagnosis of hypertension: Secondary | ICD-10-CM | POA: Diagnosis not present

## 2023-07-21 LAB — LIPID PANEL
Cholesterol: 140 mg/dL (ref 0–200)
HDL: 55.6 mg/dL (ref >39.00)
LDL Cholesterol: 74 mg/dL (ref 0–99)
NonHDL: 83.97
Total CHOL/HDL Ratio: 3
Triglycerides: 50 mg/dL (ref 0.0–149.0)
VLDL: 10 mg/dL (ref 0.0–40.0)

## 2023-07-21 LAB — COMPREHENSIVE METABOLIC PANEL WITH GFR
ALT: 16 U/L (ref 0–53)
AST: 20 U/L (ref 0–37)
Albumin: 4.6 g/dL (ref 3.5–5.2)
Alkaline Phosphatase: 39 U/L (ref 39–117)
BUN: 16 mg/dL (ref 6–23)
CO2: 28 meq/L (ref 19–32)
Calcium: 9.3 mg/dL (ref 8.4–10.5)
Chloride: 105 meq/L (ref 96–112)
Creatinine, Ser: 1.07 mg/dL (ref 0.40–1.50)
GFR: 69.69 mL/min (ref >60.00)
Glucose, Bld: 118 mg/dL — ABNORMAL HIGH (ref 70–99)
Potassium: 4.4 meq/L (ref 3.5–5.1)
Sodium: 140 meq/L (ref 135–145)
Total Bilirubin: 0.9 mg/dL (ref 0.2–1.2)
Total Protein: 6.7 g/dL (ref 6.0–8.3)

## 2023-07-21 MED ORDER — ATORVASTATIN CALCIUM 40 MG PO TABS: 40.0000 mg | ORAL_TABLET | Freq: Every day | ORAL | 3 refills | Status: AC

## 2023-07-30 ENCOUNTER — Telehealth: Payer: Self-pay

## 2023-07-30 DIAGNOSIS — R7301 Impaired fasting glucose: Secondary | ICD-10-CM

## 2023-07-30 NOTE — Telephone Encounter (Signed)
-----   Message from Jeoffrey Massed sent at 07/30/2023  8:12 AM EDT ----- Pls notify pt that his sugar was slightly elevated but all other lab results normal. Ask him to make non-fasting lab appt to get Hba1c (venous, not point of care), dx IFG. At his earliest convenience.-thx

## 2023-08-24 DIAGNOSIS — K5730 Diverticulosis of large intestine without perforation or abscess without bleeding: Secondary | ICD-10-CM | POA: Diagnosis not present

## 2023-08-24 DIAGNOSIS — Z09 Encounter for follow-up examination after completed treatment for conditions other than malignant neoplasm: Secondary | ICD-10-CM | POA: Diagnosis not present

## 2023-08-24 DIAGNOSIS — Z860100 Personal history of colon polyps, unspecified: Secondary | ICD-10-CM | POA: Diagnosis not present

## 2023-08-24 DIAGNOSIS — D125 Benign neoplasm of sigmoid colon: Secondary | ICD-10-CM | POA: Diagnosis not present

## 2023-08-24 LAB — HM COLONOSCOPY

## 2023-08-26 DIAGNOSIS — D125 Benign neoplasm of sigmoid colon: Secondary | ICD-10-CM | POA: Diagnosis not present

## 2023-09-02 ENCOUNTER — Encounter: Payer: Self-pay | Admitting: Family Medicine

## 2023-12-01 DIAGNOSIS — H6123 Impacted cerumen, bilateral: Secondary | ICD-10-CM | POA: Diagnosis not present

## 2024-01-22 ENCOUNTER — Encounter: Payer: Self-pay | Admitting: Family Medicine

## 2024-01-22 ENCOUNTER — Ambulatory Visit (INDEPENDENT_AMBULATORY_CARE_PROVIDER_SITE_OTHER): Admitting: Family Medicine

## 2024-01-22 VITALS — BP 150/80 | HR 68 | Temp 98.2°F | Ht 65.7 in | Wt 170.0 lb

## 2024-01-22 DIAGNOSIS — R030 Elevated blood-pressure reading, without diagnosis of hypertension: Secondary | ICD-10-CM | POA: Diagnosis not present

## 2024-01-22 DIAGNOSIS — E7800 Pure hypercholesterolemia, unspecified: Secondary | ICD-10-CM | POA: Diagnosis not present

## 2024-01-22 DIAGNOSIS — Z0000 Encounter for general adult medical examination without abnormal findings: Secondary | ICD-10-CM

## 2024-01-22 DIAGNOSIS — Z125 Encounter for screening for malignant neoplasm of prostate: Secondary | ICD-10-CM

## 2024-01-22 DIAGNOSIS — R7301 Impaired fasting glucose: Secondary | ICD-10-CM | POA: Diagnosis not present

## 2024-01-22 LAB — CBC WITH DIFFERENTIAL/PLATELET
Basophils Absolute: 0 K/uL (ref 0.0–0.1)
Basophils Relative: 0.8 % (ref 0.0–3.0)
Eosinophils Absolute: 0.2 K/uL (ref 0.0–0.7)
Eosinophils Relative: 3.7 % (ref 0.0–5.0)
HCT: 48.8 % (ref 39.0–52.0)
Hemoglobin: 16.1 g/dL (ref 13.0–17.0)
Lymphocytes Relative: 25.1 % (ref 12.0–46.0)
Lymphs Abs: 1.6 K/uL (ref 0.7–4.0)
MCHC: 33 g/dL (ref 30.0–36.0)
MCV: 91 fl (ref 78.0–100.0)
Monocytes Absolute: 0.5 K/uL (ref 0.1–1.0)
Monocytes Relative: 8.4 % (ref 3.0–12.0)
Neutro Abs: 3.9 K/uL (ref 1.4–7.7)
Neutrophils Relative %: 62 % (ref 43.0–77.0)
Platelets: 248 K/uL (ref 150.0–400.0)
RBC: 5.36 Mil/uL (ref 4.22–5.81)
RDW: 14.1 % (ref 11.5–15.5)
WBC: 6.3 K/uL (ref 4.0–10.5)

## 2024-01-22 LAB — COMPREHENSIVE METABOLIC PANEL WITH GFR
ALT: 18 U/L (ref 0–53)
AST: 22 U/L (ref 0–37)
Albumin: 4.5 g/dL (ref 3.5–5.2)
Alkaline Phosphatase: 39 U/L (ref 39–117)
BUN: 17 mg/dL (ref 6–23)
CO2: 28 meq/L (ref 19–32)
Calcium: 9.6 mg/dL (ref 8.4–10.5)
Chloride: 105 meq/L (ref 96–112)
Creatinine, Ser: 1.03 mg/dL (ref 0.40–1.50)
GFR: 72.69 mL/min (ref >60.00)
Glucose, Bld: 122 mg/dL — ABNORMAL HIGH (ref 70–99)
Potassium: 4.8 meq/L (ref 3.5–5.1)
Sodium: 140 meq/L (ref 135–145)
Total Bilirubin: 0.8 mg/dL (ref 0.2–1.2)
Total Protein: 6.8 g/dL (ref 6.0–8.3)

## 2024-01-22 LAB — LIPID PANEL
Cholesterol: 144 mg/dL (ref 0–200)
HDL: 55.5 mg/dL (ref >39.00)
LDL Cholesterol: 79 mg/dL (ref 0–99)
NonHDL: 88.35
Total CHOL/HDL Ratio: 3
Triglycerides: 46 mg/dL (ref 0.0–149.0)
VLDL: 9.2 mg/dL (ref 0.0–40.0)

## 2024-01-22 LAB — HEMOGLOBIN A1C: Hgb A1c MFr Bld: 5.8 % (ref 4.6–6.5)

## 2024-01-22 LAB — PSA, MEDICARE: PSA: 0.08 ng/mL — ABNORMAL LOW (ref 0.10–4.00)

## 2024-01-22 NOTE — Patient Instructions (Signed)
 Health Maintenance, Male  Adopting a healthy lifestyle and getting preventive care are important in promoting health and wellness. Ask your health care provider about:  The right schedule for you to have regular tests and exams.  Things you can do on your own to prevent diseases and keep yourself healthy.  What should I know about diet, weight, and exercise?  Eat a healthy diet    Eat a diet that includes plenty of vegetables, fruits, low-fat dairy products, and lean protein.  Do not eat a lot of foods that are high in solid fats, added sugars, or sodium.  Maintain a healthy weight  Body mass index (BMI) is a measurement that can be used to identify possible weight problems. It estimates body fat based on height and weight. Your health care provider can help determine your BMI and help you achieve or maintain a healthy weight.  Get regular exercise  Get regular exercise. This is one of the most important things you can do for your health. Most adults should:  Exercise for at least 150 minutes each week. The exercise should increase your heart rate and make you sweat (moderate-intensity exercise).  Do strengthening exercises at least twice a week. This is in addition to the moderate-intensity exercise.  Spend less time sitting. Even light physical activity can be beneficial.  Watch cholesterol and blood lipids  Have your blood tested for lipids and cholesterol at 72 years of age, then have this test every 5 years.  You may need to have your cholesterol levels checked more often if:  Your lipid or cholesterol levels are high.  You are older than 72 years of age.  You are at high risk for heart disease.  What should I know about cancer screening?  Many types of cancers can be detected early and may often be prevented. Depending on your health history and family history, you may need to have cancer screening at various ages. This may include screening for:  Colorectal cancer.  Prostate cancer.  Skin cancer.  Lung  cancer.  What should I know about heart disease, diabetes, and high blood pressure?  Blood pressure and heart disease  High blood pressure causes heart disease and increases the risk of stroke. This is more likely to develop in people who have high blood pressure readings or are overweight.  Talk with your health care provider about your target blood pressure readings.  Have your blood pressure checked:  Every 3-5 years if you are 24-52 years of age.  Every year if you are 3 years old or older.  If you are between the ages of 60 and 72 and are a current or former smoker, ask your health care provider if you should have a one-time screening for abdominal aortic aneurysm (AAA).  Diabetes  Have regular diabetes screenings. This checks your fasting blood sugar level. Have the screening done:  Once every three years after age 66 if you are at a normal weight and have a low risk for diabetes.  More often and at a younger age if you are overweight or have a high risk for diabetes.  What should I know about preventing infection?  Hepatitis B  If you have a higher risk for hepatitis B, you should be screened for this virus. Talk with your health care provider to find out if you are at risk for hepatitis B infection.  Hepatitis C  Blood testing is recommended for:  Everyone born from [birth year] through 1964.  Anyone  with known risk factors for hepatitis C.  Sexually transmitted infections (STIs)  You should be screened each year for STIs, including gonorrhea and chlamydia, if:  You are sexually active and are younger than 72 years of age.  You are older than 72 years of age and your health care provider tells you that you are at risk for this type of infection.  Your sexual activity has changed since you were last screened, and you are at increased risk for chlamydia or gonorrhea. Ask your health care provider if you are at risk.  Ask your health care provider about whether you are at high risk for HIV. Your health care provider  may recommend a prescription medicine to help prevent HIV infection. If you choose to take medicine to prevent HIV, you should first get tested for HIV. You should then be tested every 3 months for as long as you are taking the medicine.  Follow these instructions at home:  Alcohol use  Do not drink alcohol if your health care provider tells you not to drink.  If you drink alcohol:  Limit how much you have to 0-2 drinks a day.  Know how much alcohol is in your drink. In the [place] one drink equals one 12 oz bottle of beer (355 mL), one 5 oz glass of wine (148 mL), or one 1 oz glass of hard liquor (44 mL).  Lifestyle  Do not use any products that contain nicotine or tobacco. These products include cigarettes, chewing tobacco, and vaping devices, such as e-cigarettes. If you need help quitting, ask your health care provider.  Do not use street drugs.  Do not share needles.  Ask your health care provider for help if you need support or information about quitting drugs.  General instructions  Schedule regular health, dental, and eye exams.  Stay current with your vaccines.  Tell your health care provider if:  You often feel depressed.  You have ever been abused or do not feel safe at home.  Summary  Adopting a healthy lifestyle and getting preventive care are important in promoting health and wellness.  Follow your health care provider's instructions about healthy diet, exercising, and getting tested or screened for diseases.  Follow your health care provider's instructions on monitoring your cholesterol and blood pressure.  This information is not intended to replace advice given to you by your health care provider. Make sure you discuss any questions you have with your health care provider.  Document Revised: 08/27/2020 Document Reviewed: 08/27/2020  Elsevier Patient Education  2024 [employer].

## 2024-01-22 NOTE — Progress Notes (Signed)
 Office Note 01/22/2024  CC:  Chief Complaint  Patient presents with   Annual Exam    Pt is fasting.    Patient is a 73 y.o. male who is here for annual health maintenance exam and 68-month follow-up hyperlipidemia, elevated blood pressure without diagnosis of hypertension, and impaired fasting glucose. A/P as of last visit: #1 hypercholesterolemia, doing well on a atorvastatin  40 mg a day. Monitor lipids and hepatic panel today.   2.  Elevated blood pressure without diagnosis of hypertension. Continue home blood pressure monitoring and as long as average less than 130/80 at home then no meds indicated.   3.  Bilateral shoulder pain. Resolved. Continue great exercise habits.   #4 impaired fasting glucose. Monitor fasting glucose today. Last hemoglobin A1c was 5.7% about 6 months ago.  Highest A1c was back in 2023 at 5.9%. Plan repeat hemoglobin A1c in 6 months.  INTERIM HX: Feeling well. Playing pickle ball regularly. Blood pressure this morning at home was 130/80.   No acute concerns.  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 Burnett.->CT good 03/2019.   Basal cell carcinoma nose   removed approx year 2000   Carpal tunnel syndrome on both sides    wrist splints no help   Diverticulosis    Elevated blood pressure reading without diagnosis of hypertension    blood pressure has been slightly elevated at times - but lost weight - and has never required b/p meds   Elevated PSA    History of adenomatous polyp of colon 05/24/2018   Multiple colonoscopies.  Most recent 08/2023-adenoma x 5, recall 5 yrs ([facility])   History of kidney stones    Analysis 2019->Ca++ oxalate.   History of thyroid  nodule 2014   nodule found on chest CT 2014.  Resected by Dr. Marcy  lobectomy->benign    Hypercholesterolemia    IFG (impaired fasting glucose)    115 03/2018.  A1c 5.6% and fasting gluc 112 Feb 2021. A1c 5.6% Nov 2021.   Prostate cancer (HCC) 01/2021   Elev PSA->Bx + Prost ca 02/14/21-->Rad seed implants 07/2021.    Past Surgical History:  Procedure Laterality Date   ABI's  02/2019   Bilat: NORMAL    basal cell carcinoma area removed     more than 20 yrs ago   COLONOSCOPY     multiple.  08/24/23 polyps x 5, recall 5 yrs   CYSTOSCOPY WITH STENT PLACEMENT Left 01/31/2018   Procedure: CYSTOSCOPY WITH RIGHT URETERAL STENT PLACEMENT;  Surgeon: Renda Glance, MD;  Location: [facility];  Service: Urology;  Laterality: Left;   CYSTOSCOPY/URETEROSCOPY/HOLMIUM LASER/STENT PLACEMENT Left 02/08/2018   For extraction of L ureteral stone, L ureteroscopic laser lithotripsy, + ureteral stent placement. Procedure: CYSTOSCOPY/URETEROSCOPY/HOLMIUM LASER/STENT PLACEMENT;  Surgeon: Renda Glance, MD;  Location: [facility];  Service: Urology;  Laterality: Left;  1 HR   LITHOTRIPSY     pt states that this was done a long time ago   RADIOACTIVE SEED IMPLANT N/A 07/15/2021   Procedure: RADIOACTIVE SEED IMPLANT/BRACHYTHERAPY IMPLANT WITH CYSTOSCOPY;  Surgeon: Renda Glance, MD;  Location: [facility];  Service: Urology;  Laterality: N/A;   SPACE OAR INSTILLATION N/A 07/15/2021   Procedure: SPACE OAR INSTILLATION;  Surgeon: Renda Glance, MD;  Location: [facility];  Service: Urology;  Laterality:  N/A;   THYROID  LOBECTOMY Left 08/12/2013   Procedure: THYROID  LOBECTOMY;  Surgeon: Krystal CHRISTELLA Spinner, MD;  Location: [facility];  Service: General;  Laterality: Left;    Family History  Problem Relation Age of Onset   Breast cancer Mother    Cancer Mother    Colon cancer Father    Breast cancer Sister     Social History   Socioeconomic History   Marital status: Married    Spouse name: Not on file   Number of children: Not on file   Years of education: Not on file   Highest  education level: Not on file  Occupational History   Not on file  Tobacco Use   Smoking status: Former    Current packs/day: 1.00    Average packs/day: 1 pack/day for 10.0 years (10.0 ttl pk-yrs)    Types: Cigarettes   Smokeless tobacco: Former    Types: Engineer, drilling   Vaping status: Never Used  Substance and Sexual Activity   Alcohol use: Yes    Comment: quit smoking 45 yrs ago  alcohol occ beer   Drug use: No   Sexual activity: Not on file  Other Topics Concern   Not on file  Social History Narrative   Married, 2 sons grown.  4 GC.   Orig from [place].   Occup: retired from [employer] at the [employer] on [address].   No Tob.   Occ alcohol.   Social Drivers of Corporate investment banker Strain: Low Risk  (12/31/2022)   Overall Financial Resource Strain (CARDIA)    Difficulty of Paying Living Expenses: Not hard at all  Food Insecurity: No Food Insecurity (12/31/2022)   Hunger Vital Sign    Worried About Running Out of Food in the Last Year: Never true    Ran Out of Food in the Last Year: Never true  Transportation Needs: No Transportation Needs (12/31/2022)   PRAPARE - Administrator, Civil Service (Medical): No    Lack of Transportation (Non-Medical): No  Physical Activity: Sufficiently Active (12/31/2022)   Exercise Vital Sign    Days of Exercise per Week: 5 days    Minutes of Exercise per Session: 150+ min  Stress: No Stress Concern Present (12/31/2022)   [employer] of Occupational Health - Occupational Stress Questionnaire    Feeling of Stress : Not at all  Social Connections: Moderately Integrated (12/31/2022)   Social Connection and Isolation Panel    Frequency of Communication with Friends and Family: More than three times a week    Frequency of Social Gatherings with Friends and Family: More than three times a week    Attends Religious Services: More than 4 times per year    Active Member of [employer] or Organizations: No    Attends Occupational hygienist Meetings: Never    Marital Status: Married  Catering manager Violence: Not At Risk (12/31/2022)   Humiliation, Afraid, Rape, and Kick questionnaire    Fear of Current or Ex-Partner: No    Emotionally Abused: No    Physically Abused: No    Sexually Abused: No    Outpatient Medications Prior to Visit  Medication Sig Dispense Refill   atorvastatin  (LIPITOR) 40 MG tablet Take 1 tablet (40 mg total) by mouth daily. 90 tablet 3   meloxicam  (MOBIC ) 7.5 MG tablet 1-2 tabs po qd prn pain 60 tablet 2   tamsulosin  (FLOMAX ) 0.4 MG CAPS capsule Take 0.4 mg by  mouth.     Coenzyme Q10 (COQ10) 200 MG CAPS Take 200 mg by mouth daily. (Patient not taking: Reported on 01/22/2024)     triamcinolone (NASACORT) 55 MCG/ACT AERO nasal inhaler Place 2 sprays into the nose daily. (Patient not taking: Reported on 01/22/2024)     No facility-administered medications prior to visit.    No Known Allergies  Review of Systems  Constitutional:  Negative for appetite change, chills, fatigue and fever.  HENT:  Negative for congestion, dental problem, ear pain and sore throat.   Eyes:  Negative for discharge, redness and visual disturbance.  Respiratory:  Negative for cough, chest tightness, shortness of breath and wheezing.   Cardiovascular:  Negative for chest pain, palpitations and leg swelling.  Gastrointestinal:  Negative for abdominal pain, blood in stool, diarrhea, nausea and vomiting.  Genitourinary:  Negative for difficulty urinating, dysuria, flank pain, frequency, hematuria and urgency.  Musculoskeletal:  Negative for arthralgias, back pain, joint swelling, myalgias and neck stiffness.  Skin:  Negative for pallor and rash.  Neurological:  Negative for dizziness, speech difficulty, weakness and headaches.  Hematological:  Negative for adenopathy. Does not bruise/bleed easily.  Psychiatric/Behavioral:  Negative for confusion and sleep disturbance. The patient is not nervous/anxious.      PE;    01/22/2024    8:10 AM 01/22/2024    7:55 AM 07/21/2023    8:22 AM  Vitals with BMI  Height  5' 5.7   Weight  170 lbs   BMI  27.68   Systolic 150 148 865  Diastolic 80 83 70  Pulse  68    Gen: Alert, well appearing.  Patient is oriented to person, place, time, and situation. AFFECT: pleasant, lucid thought and speech. ENT: Ears: EACs clear, normal epithelium.  TMs with good light reflex and landmarks bilaterally.  Eyes: no injection, icteris, swelling, or exudate.  EOMI, PERRLA. Nose: no drainage or turbinate edema/swelling.  No injection or focal lesion.  Mouth: lips without lesion/swelling.  Oral mucosa pink and moist.  Dentition intact and without obvious caries or gingival swelling.  Oropharynx without erythema, exudate, or swelling.  Neck: supple/nontender.  No LAD, mass, or TM.  Carotid pulses 2+ bilaterally, without bruits. CV: RRR, no m/r/g.   LUNGS: CTA bilat, nonlabored resps, good aeration in all lung fields. ABD: soft, NT, ND, BS normal.  No hepatospenomegaly or mass.  No bruits. EXT: no clubbing, cyanosis, or edema.  Musculoskeletal: no joint swelling, erythema, warmth, or tenderness.  ROM of all joints intact. Skin - no sores or suspicious lesions or rashes or color changes  Pertinent labs:  Lab Results  Component Value Date   TSH 1.72 01/20/2023   Lab Results  Component Value Date   WBC 5.8 01/20/2023   HGB 16.1 01/20/2023   HCT 49.1 01/20/2023   MCV 90.1 01/20/2023   PLT 264.0 01/20/2023   Lab Results  Component Value Date   CREATININE 1.07 07/21/2023   BUN 16 07/21/2023   NA 140 07/21/2023   K 4.4 07/21/2023   CL 105 07/21/2023   CO2 28 07/21/2023   Lab Results  Component Value Date   ALT 16 07/21/2023   AST 20 07/21/2023   ALKPHOS 39 07/21/2023   BILITOT 0.9 07/21/2023   Lab Results  Component Value Date   CHOL 140 07/21/2023   Lab Results  Component Value Date   HDL 55.60 07/21/2023   Lab Results  Component Value Date    LDLCALC 74 07/21/2023   Lab  Results  Component Value Date   TRIG 50.0 07/21/2023   Lab Results  Component Value Date   CHOLHDL 3 07/21/2023   Lab Results  Component Value Date   PSA 0.16 05/20/2023   PSA 0.17 01/20/2023   PSA 0.33 03/05/2022   Lab Results  Component Value Date   HGBA1C 5.7 01/20/2023   ASSESSMENT AND PLAN:   1 health maintenance exam: Reviewed age and gender appropriate health maintenance issues (prudent diet, regular exercise, health risks of tobacco and excessive alcohol, use of seatbelts, fire alarms in home, use of sunscreen).  Also reviewed age and gender appropriate health screening as well as vaccine recommendations. Vaccines: Flu->declined.  Shingrix->declined. Otherwise all UTD. Labs: fasting HP, PSA. Prostate ca screening: Has prostate cancer-->brachytherapy, PSAs followed by urology usually but he prefers to get PSA here today. Colon ca screening: hx of polyps, next colonoscopy due 08/2028.  #2 whitecoat hypertension. Continue periodic home blood pressure monitoring.  #3 hypercholesterolemia, doing well on atorvastatin  40 mg a day. LDL was 74 approximately 6 months ago. Lipid panel and hepatic panel today.  An After Visit Summary was printed and given to the patient.  FOLLOW UP:  Return in about 6 months (around 07/22/2024) for routine chronic illness f/u.  Signed:  Gerlene Hockey, MD           01/22/2024

## 2024-01-24 ENCOUNTER — Ambulatory Visit: Payer: Self-pay | Admitting: Family Medicine

## 2024-03-29 ENCOUNTER — Encounter: Payer: Self-pay | Admitting: Family Medicine

## 2024-04-26 ENCOUNTER — Telehealth: Payer: Self-pay

## 2024-04-26 MED ORDER — SCOPOLAMINE 1 MG/3DAYS TD PT72: 1.0000 | MEDICATED_PATCH | TRANSDERMAL | 0 refills | Status: AC

## 2024-04-26 NOTE — Addendum Note (Signed)
 Addended by: CANDISE ALEENE DEL on: 04/26/2024 09:23 PM   Modules accepted: Orders

## 2024-04-26 NOTE — Telephone Encounter (Signed)
 Primary Information  Source  Jason Hansen, Jason Hansen. (Patient)   Subject  Jason Hansen, Jason Hansen. (Patient)   Topic  Clinical - Medication Question    Communication  Reason for CRM: Patient is going on a fairly long cruise and would like if 20 patches to prevent nausea could be sent to his pharmacy.        [facility] [place], [place] - [address]    [address] [place] [address]    Phone: [phone] Fax: [phone]    Hours: Not open 24 hours      Please call the patient at [phone] for an update.

## 2024-04-26 NOTE — Telephone Encounter (Signed)
"  Rx sent  "

## 2024-04-27 NOTE — Telephone Encounter (Signed)
 Pt advised of rx update, medication sent to preferred pharmacy.

## 2024-07-22 ENCOUNTER — Ambulatory Visit: Admitting: Family Medicine

## 2024-07-29 ENCOUNTER — Ambulatory Visit: Admitting: Family Medicine
